# Patient Record
Sex: Male | Born: 1935
Health system: Southern US, Community
[De-identification: ages and names within clinical notes are randomized; demographics above are authoritative.]

## PROBLEM LIST (undated history)

## (undated) DIAGNOSIS — G629 Polyneuropathy, unspecified: Secondary | ICD-10-CM

## (undated) DIAGNOSIS — C801 Malignant (primary) neoplasm, unspecified: Secondary | ICD-10-CM

## (undated) DIAGNOSIS — I10 Essential (primary) hypertension: Secondary | ICD-10-CM

## (undated) DIAGNOSIS — H269 Unspecified cataract: Secondary | ICD-10-CM

## (undated) DIAGNOSIS — C20 Malignant neoplasm of rectum: Secondary | ICD-10-CM

## (undated) DIAGNOSIS — E079 Disorder of thyroid, unspecified: Secondary | ICD-10-CM

## (undated) HISTORY — PX: PROSTATE SURGERY: SHX751

## (undated) HISTORY — PX: HERNIA REPAIR: SHX51

## (undated) HISTORY — PX: EYE SURGERY: SHX253

## (undated) HISTORY — DX: Unspecified cataract: H26.9

## (undated) HISTORY — DX: Polyneuropathy, unspecified: G62.9

## (undated) HISTORY — PX: COLON RESECTION: SHX5231

---

## 1993-11-04 HISTORY — PX: OTHER SURGICAL HISTORY: SHX169

## 1993-11-04 HISTORY — PX: COLONOSCOPY: SHX174

## 2005-03-04 ENCOUNTER — Emergency Department: Payer: Self-pay | Admitting: General Practice

## 2005-03-13 ENCOUNTER — Other Ambulatory Visit: Payer: Self-pay

## 2005-03-14 ENCOUNTER — Inpatient Hospital Stay: Payer: Self-pay | Admitting: Internal Medicine

## 2005-09-19 ENCOUNTER — Emergency Department: Payer: Self-pay | Admitting: Unknown Physician Specialty

## 2005-11-25 ENCOUNTER — Emergency Department: Payer: Self-pay | Admitting: Emergency Medicine

## 2008-12-12 ENCOUNTER — Ambulatory Visit: Payer: Self-pay | Admitting: Family Medicine

## 2009-12-09 ENCOUNTER — Emergency Department: Payer: Self-pay | Admitting: Emergency Medicine

## 2010-05-21 ENCOUNTER — Inpatient Hospital Stay: Payer: Self-pay | Admitting: Surgery

## 2011-08-14 ENCOUNTER — Emergency Department: Payer: Self-pay | Admitting: Emergency Medicine

## 2013-11-10 ENCOUNTER — Emergency Department: Payer: Self-pay | Admitting: Emergency Medicine

## 2013-11-10 LAB — CBC WITH DIFFERENTIAL/PLATELET
BASOS ABS: 0.1 10*3/uL (ref 0.0–0.1)
Basophil %: 0.9 %
EOS ABS: 0.2 10*3/uL (ref 0.0–0.7)
EOS PCT: 2.9 %
HCT: 43.5 % (ref 40.0–52.0)
HGB: 14.2 g/dL (ref 13.0–18.0)
LYMPHS PCT: 18.2 %
Lymphocyte #: 1.3 10*3/uL (ref 1.0–3.6)
MCH: 28.8 pg (ref 26.0–34.0)
MCHC: 32.6 g/dL (ref 32.0–36.0)
MCV: 88 fL (ref 80–100)
MONOS PCT: 12.8 %
Monocyte #: 0.9 x10 3/mm (ref 0.2–1.0)
NEUTROS PCT: 65.2 %
Neutrophil #: 4.5 10*3/uL (ref 1.4–6.5)
PLATELETS: 161 10*3/uL (ref 150–440)
RBC: 4.93 10*6/uL (ref 4.40–5.90)
RDW: 14.2 % (ref 11.5–14.5)
WBC: 7 10*3/uL (ref 3.8–10.6)

## 2013-11-10 LAB — BASIC METABOLIC PANEL
ANION GAP: 10 (ref 7–16)
BUN: 21 mg/dL — ABNORMAL HIGH (ref 7–18)
CALCIUM: 8.9 mg/dL (ref 8.5–10.1)
CHLORIDE: 107 mmol/L (ref 98–107)
CO2: 17 mmol/L — AB (ref 21–32)
CREATININE: 1.07 mg/dL (ref 0.60–1.30)
EGFR (African American): >60
EGFR (Non-African Amer.): 58 — ABNORMAL LOW
GLUCOSE: 103 mg/dL — AB (ref 65–99)
OSMOLALITY: 271 (ref 275–301)
Potassium: 4.2 mmol/L (ref 3.5–5.1)
Sodium: 134 mmol/L — ABNORMAL LOW (ref 136–145)

## 2013-11-10 LAB — TROPONIN I

## 2014-11-11 DIAGNOSIS — G629 Polyneuropathy, unspecified: Secondary | ICD-10-CM | POA: Diagnosis not present

## 2014-11-11 DIAGNOSIS — Z0001 Encounter for general adult medical examination with abnormal findings: Secondary | ICD-10-CM | POA: Diagnosis not present

## 2014-11-11 DIAGNOSIS — I10 Essential (primary) hypertension: Secondary | ICD-10-CM | POA: Diagnosis not present

## 2014-11-11 DIAGNOSIS — Z433 Encounter for attention to colostomy: Secondary | ICD-10-CM | POA: Diagnosis not present

## 2014-11-16 ENCOUNTER — Inpatient Hospital Stay: Payer: Self-pay | Admitting: Surgery

## 2014-11-16 DIAGNOSIS — K5660 Unspecified intestinal obstruction: Secondary | ICD-10-CM | POA: Diagnosis not present

## 2014-11-16 DIAGNOSIS — R112 Nausea with vomiting, unspecified: Secondary | ICD-10-CM | POA: Diagnosis not present

## 2014-11-16 DIAGNOSIS — R9431 Abnormal electrocardiogram [ECG] [EKG]: Secondary | ICD-10-CM | POA: Diagnosis not present

## 2014-11-16 DIAGNOSIS — Z9049 Acquired absence of other specified parts of digestive tract: Secondary | ICD-10-CM | POA: Diagnosis not present

## 2014-11-16 DIAGNOSIS — N183 Chronic kidney disease, stage 3 (moderate): Secondary | ICD-10-CM | POA: Diagnosis not present

## 2014-11-16 DIAGNOSIS — K5669 Other intestinal obstruction: Secondary | ICD-10-CM | POA: Diagnosis not present

## 2014-11-16 DIAGNOSIS — I10 Essential (primary) hypertension: Secondary | ICD-10-CM | POA: Diagnosis not present

## 2014-11-16 DIAGNOSIS — Z932 Ileostomy status: Secondary | ICD-10-CM | POA: Diagnosis not present

## 2014-11-16 DIAGNOSIS — R531 Weakness: Secondary | ICD-10-CM | POA: Diagnosis not present

## 2014-11-16 DIAGNOSIS — E780 Pure hypercholesterolemia: Secondary | ICD-10-CM | POA: Diagnosis not present

## 2014-11-16 DIAGNOSIS — E876 Hypokalemia: Secondary | ICD-10-CM | POA: Diagnosis not present

## 2014-11-16 DIAGNOSIS — I129 Hypertensive chronic kidney disease with stage 1 through stage 4 chronic kidney disease, or unspecified chronic kidney disease: Secondary | ICD-10-CM | POA: Diagnosis not present

## 2014-11-16 DIAGNOSIS — E039 Hypothyroidism, unspecified: Secondary | ICD-10-CM | POA: Diagnosis not present

## 2014-11-16 DIAGNOSIS — K460 Unspecified abdominal hernia with obstruction, without gangrene: Secondary | ICD-10-CM | POA: Diagnosis not present

## 2014-11-16 DIAGNOSIS — K565 Intestinal adhesions [bands] with obstruction (postprocedural) (postinfection): Secondary | ICD-10-CM | POA: Diagnosis not present

## 2014-11-16 LAB — URINALYSIS, COMPLETE
BLOOD: NEGATIVE
Bacteria: NONE SEEN
Bilirubin,UR: NEGATIVE
Glucose,UR: NEGATIVE mg/dL (ref 0–75)
Leukocyte Esterase: NEGATIVE
NITRITE: NEGATIVE
PH: 6 (ref 4.5–8.0)
Protein: 30
Specific Gravity: 1.02 (ref 1.003–1.030)
Squamous Epithelial: 1
WBC UR: 1 /HPF (ref 0–5)

## 2014-11-16 LAB — CBC
HCT: 49.8 % (ref 40.0–52.0)
HGB: 15.9 g/dL (ref 13.0–18.0)
MCH: 28.8 pg (ref 26.0–34.0)
MCHC: 32 g/dL (ref 32.0–36.0)
MCV: 90 fL (ref 80–100)
Platelet: 231 10*3/uL (ref 150–440)
RBC: 5.53 10*6/uL (ref 4.40–5.90)
RDW: 14.7 % — ABNORMAL HIGH (ref 11.5–14.5)
WBC: 16.3 10*3/uL — ABNORMAL HIGH (ref 3.8–10.6)

## 2014-11-16 LAB — COMPREHENSIVE METABOLIC PANEL
AST: 15 U/L (ref 15–37)
Albumin: 3.9 g/dL (ref 3.4–5.0)
Alkaline Phosphatase: 72 U/L
Anion Gap: 8 (ref 7–16)
BUN: 21 mg/dL — ABNORMAL HIGH (ref 7–18)
Bilirubin,Total: 1 mg/dL (ref 0.2–1.0)
CO2: 28 mmol/L (ref 21–32)
Calcium, Total: 8.9 mg/dL (ref 8.5–10.1)
Chloride: 103 mmol/L (ref 98–107)
Creatinine: 1.38 mg/dL — ABNORMAL HIGH (ref 0.60–1.30)
EGFR (African American): >60
GFR CALC NON AF AMER: 51 — AB
Glucose: 205 mg/dL — ABNORMAL HIGH (ref 65–99)
OSMOLALITY: 286 (ref 275–301)
POTASSIUM: 3.4 mmol/L — AB (ref 3.5–5.1)
SGPT (ALT): 20 U/L
SODIUM: 139 mmol/L (ref 136–145)
Total Protein: 7.3 g/dL (ref 6.4–8.2)

## 2014-11-16 LAB — LIPASE, BLOOD: Lipase: 251 U/L (ref 73–393)

## 2014-11-16 LAB — TROPONIN I: Troponin-I: <0.02 ng/mL

## 2014-11-17 LAB — BASIC METABOLIC PANEL
Anion Gap: 4 — ABNORMAL LOW (ref 7–16)
BUN: 21 mg/dL — ABNORMAL HIGH (ref 7–18)
CALCIUM: 8.2 mg/dL — AB (ref 8.5–10.1)
CO2: 31 mmol/L (ref 21–32)
Chloride: 108 mmol/L — ABNORMAL HIGH (ref 98–107)
Creatinine: 1.34 mg/dL — ABNORMAL HIGH (ref 0.60–1.30)
EGFR (Non-African Amer.): 52 — ABNORMAL LOW
Glucose: 131 mg/dL — ABNORMAL HIGH (ref 65–99)
OSMOLALITY: 290 (ref 275–301)
Potassium: 4.7 mmol/L (ref 3.5–5.1)
Sodium: 143 mmol/L (ref 136–145)

## 2014-11-17 LAB — CBC WITH DIFFERENTIAL/PLATELET
Basophil #: 0 10*3/uL (ref 0.0–0.1)
Basophil %: 0.4 %
Eosinophil #: 0.1 10*3/uL (ref 0.0–0.7)
Eosinophil %: 0.6 %
HCT: 43.9 % (ref 40.0–52.0)
HGB: 14 g/dL (ref 13.0–18.0)
LYMPHS ABS: 1.3 10*3/uL (ref 1.0–3.6)
Lymphocyte %: 9.8 %
MCH: 29.2 pg (ref 26.0–34.0)
MCHC: 32 g/dL (ref 32.0–36.0)
MCV: 91 fL (ref 80–100)
Monocyte #: 2 x10 3/mm — ABNORMAL HIGH (ref 0.2–1.0)
Monocyte %: 14.3 %
NEUTROS ABS: 10.2 10*3/uL — AB (ref 1.4–6.5)
Neutrophil %: 74.9 %
PLATELETS: 192 10*3/uL (ref 150–440)
RBC: 4.8 10*6/uL (ref 4.40–5.90)
RDW: 15 % — AB (ref 11.5–14.5)
WBC: 13.7 10*3/uL — ABNORMAL HIGH (ref 3.8–10.6)

## 2014-11-19 LAB — BASIC METABOLIC PANEL
Anion Gap: 7 (ref 7–16)
BUN: 16 mg/dL (ref 7–18)
CALCIUM: 7.9 mg/dL — AB (ref 8.5–10.1)
Chloride: 109 mmol/L — ABNORMAL HIGH (ref 98–107)
Co2: 26 mmol/L (ref 21–32)
Creatinine: 0.97 mg/dL (ref 0.60–1.30)
EGFR (African American): >60
GLUCOSE: 90 mg/dL (ref 65–99)
OSMOLALITY: 284 (ref 275–301)
Potassium: 3.8 mmol/L (ref 3.5–5.1)
Sodium: 142 mmol/L (ref 136–145)

## 2014-12-01 DIAGNOSIS — K560 Paralytic ileus: Secondary | ICD-10-CM | POA: Diagnosis not present

## 2014-12-01 DIAGNOSIS — I10 Essential (primary) hypertension: Secondary | ICD-10-CM | POA: Diagnosis not present

## 2014-12-08 DIAGNOSIS — C61 Malignant neoplasm of prostate: Secondary | ICD-10-CM | POA: Diagnosis not present

## 2014-12-08 DIAGNOSIS — I10 Essential (primary) hypertension: Secondary | ICD-10-CM | POA: Diagnosis not present

## 2015-01-18 DIAGNOSIS — M25569 Pain in unspecified knee: Secondary | ICD-10-CM | POA: Diagnosis not present

## 2015-01-18 DIAGNOSIS — Z933 Colostomy status: Secondary | ICD-10-CM | POA: Diagnosis not present

## 2015-02-13 DIAGNOSIS — Z0000 Encounter for general adult medical examination without abnormal findings: Secondary | ICD-10-CM | POA: Diagnosis not present

## 2015-02-13 DIAGNOSIS — C61 Malignant neoplasm of prostate: Secondary | ICD-10-CM | POA: Diagnosis not present

## 2015-02-13 DIAGNOSIS — R5381 Other malaise: Secondary | ICD-10-CM | POA: Diagnosis not present

## 2015-02-27 DIAGNOSIS — I10 Essential (primary) hypertension: Secondary | ICD-10-CM | POA: Diagnosis not present

## 2015-03-05 NOTE — Discharge Summary (Signed)
PATIENT NAME:  Gregory Valdez, Gregory Valdez MR#:  [id number] DATE OF BIRTH:  Oct 13, 1915  DATE OF ADMISSION:  11/16/2014 DATE OF DISCHARGE:  11/19/2014  BRIEF HISTORY: Gregory Valdez is a 81 year old gentleman with previous total colectomy and end ileostomy with a known peristomal hernia. He developed evidence of obstruction with lack of ostomy output and significant crampy abdominal pain. CT scan, however, demonstrated a more proximal area of stenosis, which appeared to be adhesive in nature. The patient was placed on nasogastric decompression. His symptoms improved. He is able to tolerate a regular diet at this point and his ostomy function is near normal. His hernia is easily reducible. No other particular problems. We will plan to discharge this morning. He will be followed in the office in the future as necessary. We will return him to his primary care physician's care.  DISCHARGE MEDICATIONS: Include his normal medicines, levothyroxine 88 mcg once a day and captopril 50 mg b.i.d.   FINAL DISCHARGE DIAGNOSIS: Partial small bowel obstruction.    ____________________________ Rodena Goldmann III, MD rle:ts D: 11/19/2014 10:59:00 ET T: 11/19/2014 21:52:51 ET JOB#: [id number]  cc: Rodena Goldmann III, MD, <Dictator> Dion Body, MD Rodena Goldmann MD ELECTRONICALLY SIGNED 11/28/2014 20:09

## 2015-03-05 NOTE — Consult Note (Signed)
PATIENT NAME:  Gregory Valdez, Gregory Valdez MR#:  [id number] DATE OF BIRTH:  04-Oct-1915  DATE OF CONSULTATION:  11/16/2014  ADMITTING PHYSICIAN:  Rodena Goldmann III, MD  PRIMARY CARE PHYSICIAN:   Cletis Athens, MD  CONSULTING PHYSICIAN:  Gladstone Lighter, MD  REASON FOR CONSULTATION:  Medical management.  HISTORY OF PRESENT ILLNESS: [name] is a 81 year old, very active, functional Caucasian male with past medical history significant for hypertension, prior colostomy with ileostomy bag currently for diverticulosis in the past, prior bowel obstruction requiring adhesion lysis, hypothyroidism, presents to the hospital secondary to sudden onset of nausea and vomiting since last night. The patient said that he had a prior bowel obstruction about a few years ago for which [name] had managed him in the past. He was doing fine up until last night when he suddenly felt abdominal pain, distention, nausea, vomiting, kept on going until this morning. Presents to the hospital. CT showing small bowel obstruction. So, he is being admitted and medical consult has been requested for medical management.   The patient only has a history of hypertension and hypothyroidism. Blood pressure is elevated, currently, may be partially from pain and distress. He takes only 2 medications at home. Has not been sick recently.  Has been doing well otherwise.   PAST MEDICAL HISTORY: 1.  Hypertension.  2.  Hypothyroidism.  3.  History of pancreatitis in the past.   PAST SURGICAL HISTORY:  1.  Partial colectomy with ileostomy in the right lower quadrant. 2.  Hernia repair.  3.  Left ankle surgery.   ALLERGIES TO MEDICATIONS: SULFA.   CURRENT HOME MEDICATIONS:  1.  Levothyroxine 88 mcg 1 tablet p.o. daily.  2.  Captopril 50 mg p.o. b.i.d.   SOCIAL HISTORY: Lives at home by himself, has a walker and a cane, but does not use them often. No smoking or alcohol use.   FAMILY HISTORY: Significant for multiple types of cancer running  in the family and also diabetes mellitus.   REVIEW OF SYSTEMS:  CONSTITUTIONAL: No fever, fatigue, or weakness.  EYES: No blurred vision, double vision, inflammation, or glaucoma.  ENT: No tinnitus, ear pain, hearing loss, epistaxis, or discharge.  RESPIRATORY: No cough, wheeze, hemoptysis, or COPD.  CARDIOVASCULAR: No chest pain, orthopnea, edema, arrhythmia, palpitations, or syncope.  GASTROINTESTINAL:  Positive for nausea, vomiting. No diarrhea, positive for abdominal pain from intestinal obstruction. GENITOURINARY:  No dysuria, hematuria, renal calculus, frequency, or incontinence.  ENDOCRINE: No polyuria, nocturia, thyroid problems, heat or cold intolerance.  HEMATOLOGY: No anemia, easy bruising, or bleeding.  SKIN: No acne, rash, or lesions.  MUSCULOSKELETAL: No neck, back, shoulder pain, arthritis, or gout.  NEUROLOGIC: No numbness, weakness, CVA, TIA, or seizures.  PSYCHOLOGICAL: No anxiety, insomnia, or depression.   PHYSICAL EXAMINATION:  VITAL SIGNS: Temperature 98.5 degrees Fahrenheit, pulse 96, respirations 18, blood pressure 186/98, pulse oximetry 94% on  2 liters.  GENERAL: Well-built, well-nourished male sitting in a chair, not in any acute distress.  HEENT: Normocephalic, atraumatic. Pupils equal, round, reacting to light. Anicteric sclerae. Extraocular movements intact. Oropharynx clear without erythema, mass, or exudates.  NECK: Supple. No thyromegaly, JVD, or carotid bruits.  No lymphadenopathy. LUNGS: Moving air bilaterally. No wheezes or crackles.  No use of accessory muscles for breathing. Decreased bibasilar breath sounds.  CARDIOVASCULAR: S1, S2, regular rate and rhythm. A 2/6 systolic murmur present. No  rubs or gallops.  ABDOMEN: Soft, with voluntary guarding.  No rigidity or rebound tenderness.  Ileostomy bag in right  lower quadrant and hernia present. Hyperactive bowel sounds are present.  No hepatosplenomegaly.  EXTREMITIES: No pedal edema. No clubbing or  cyanosis, 2+ dorsalis pedis pulses palpable bilaterally.  SKIN: No acne, rash, or lesions.  LYMPHATIC: No cervical or inguinal lymphadenopathy.  NEUROLOGIC: Cranial nerves intact. No focal motor or sensory deficits.  PSYCHOLOGICAL: The patient is awake, alert, oriented x 3.   LABORATORY DATA: Sodium 139, potassium 3.4, chloride 103, bicarbonate 28, BUN 21, creatinine 1.3, glucose 205, and calcium of 8.9. ALT 20, AST 15, alkaline phosphatase 72, total bilirubin 1.0, albumin of 3.9. WBC 16.3, hemoglobin 15.9, hematocrit 49.8, platelet count 231,000 and lipase 251. Troponin is negative. Urinalysis negative for any infection.   IMAGING:  CT of the abdomen and pelvis with contrast showing prior colectomy. Findings compatible with small bowel obstruction, previous adhesions present, multiple herniated small bowel loops at ostomy site.  No level of obstruction at that site.  ASSESSMENT AND RECOMMENDATIONS: A 81 year old male with hypertension, hypothyroidism, prior colectomy and ileostomy, who presents with small bowel obstruction. Medical consult has been requested for medical management.  1.  Hypertension, elevated blood pressure, not sure if it is pain related. He is on captopril p.o. b.i.d.   Currently strict n.p.o. from is bowel obstruction.  Will place on clonidine patch and also hydralazine p.o. and intravenous.  2.   Hypothyroidism.  Hold Synthroid for a couple of days, while he is n.p.o. can be restarted. If he still continues to be n.p.o., then probably will change it to intravenous dose at that time.  3.  Small bowel obstruction. Management per surgery. Currently strict n.p.o., has nasogastric tube, trying to see if he can be managed conservatively. Antiemetics, IV fluids.  4.  Hypokalemia, being replaced. Recheck in a.m.  5.  Acute renal failure, likely prerenal from  nausea, vomiting. Intravenous fluids and monitor. 6.  Deep vein thrombosis prophylaxis, thromboembolic deterrent, sequential  compression devices and patient is ambulated.  CODE STATUS:  Full code.   TIME SPENT ON ADMISSION: 50 minutes.    ____________________________ Gladstone Lighter, MD rk:LT D: 11/16/2014 17:57:55 ET T: 11/16/2014 18:28:52 ET JOB#: [id number]  cc: Gladstone Lighter, MD, <Dictator> Cletis Athens, MD Micheline Maze, MD Gladstone Lighter MD ELECTRONICALLY SIGNED 11/21/2014 16:17

## 2015-04-06 DIAGNOSIS — H3531 Nonexudative age-related macular degeneration: Secondary | ICD-10-CM | POA: Diagnosis not present

## 2015-04-13 DIAGNOSIS — Z933 Colostomy status: Secondary | ICD-10-CM | POA: Diagnosis not present

## 2015-04-13 DIAGNOSIS — M25569 Pain in unspecified knee: Secondary | ICD-10-CM | POA: Diagnosis not present

## 2015-04-28 DIAGNOSIS — L259 Unspecified contact dermatitis, unspecified cause: Secondary | ICD-10-CM | POA: Diagnosis not present

## 2015-04-28 DIAGNOSIS — C61 Malignant neoplasm of prostate: Secondary | ICD-10-CM | POA: Diagnosis not present

## 2015-04-28 DIAGNOSIS — I10 Essential (primary) hypertension: Secondary | ICD-10-CM | POA: Diagnosis not present

## 2015-04-28 DIAGNOSIS — R2681 Unsteadiness on feet: Secondary | ICD-10-CM | POA: Diagnosis not present

## 2015-05-02 DIAGNOSIS — L259 Unspecified contact dermatitis, unspecified cause: Secondary | ICD-10-CM | POA: Diagnosis not present

## 2015-05-02 DIAGNOSIS — I10 Essential (primary) hypertension: Secondary | ICD-10-CM | POA: Diagnosis not present

## 2015-05-15 DIAGNOSIS — R2681 Unsteadiness on feet: Secondary | ICD-10-CM | POA: Diagnosis not present

## 2015-05-25 DIAGNOSIS — I10 Essential (primary) hypertension: Secondary | ICD-10-CM | POA: Diagnosis not present

## 2015-05-25 DIAGNOSIS — K625 Hemorrhage of anus and rectum: Secondary | ICD-10-CM | POA: Diagnosis not present

## 2015-06-07 DIAGNOSIS — I10 Essential (primary) hypertension: Secondary | ICD-10-CM | POA: Diagnosis not present

## 2015-06-07 DIAGNOSIS — K625 Hemorrhage of anus and rectum: Secondary | ICD-10-CM | POA: Diagnosis not present

## 2015-06-07 DIAGNOSIS — C61 Malignant neoplasm of prostate: Secondary | ICD-10-CM | POA: Diagnosis not present

## 2015-06-26 DIAGNOSIS — I10 Essential (primary) hypertension: Secondary | ICD-10-CM | POA: Diagnosis not present

## 2015-06-26 DIAGNOSIS — J209 Acute bronchitis, unspecified: Secondary | ICD-10-CM | POA: Diagnosis not present

## 2015-07-01 ENCOUNTER — Emergency Department
Admission: EM | Admit: 2015-07-01 | Discharge: 2015-07-01 | Disposition: A | Discharge status: Home / Self Care | Payer: Medicare Other | Attending: Emergency Medicine | Admitting: Emergency Medicine

## 2015-07-01 ENCOUNTER — Emergency Department: Payer: Medicare Other

## 2015-07-01 ENCOUNTER — Encounter: Payer: Self-pay | Admitting: *Deleted

## 2015-07-01 DIAGNOSIS — Z79899 Other long term (current) drug therapy: Secondary | ICD-10-CM | POA: Diagnosis not present

## 2015-07-01 DIAGNOSIS — R1084 Generalized abdominal pain: Secondary | ICD-10-CM | POA: Diagnosis present

## 2015-07-01 DIAGNOSIS — R1032 Left lower quadrant pain: Secondary | ICD-10-CM | POA: Diagnosis not present

## 2015-07-01 DIAGNOSIS — N289 Disorder of kidney and ureter, unspecified: Secondary | ICD-10-CM | POA: Diagnosis not present

## 2015-07-01 DIAGNOSIS — I10 Essential (primary) hypertension: Secondary | ICD-10-CM | POA: Insufficient documentation

## 2015-07-01 DIAGNOSIS — N281 Cyst of kidney, acquired: Secondary | ICD-10-CM | POA: Diagnosis not present

## 2015-07-01 DIAGNOSIS — N400 Enlarged prostate without lower urinary tract symptoms: Secondary | ICD-10-CM | POA: Diagnosis not present

## 2015-07-01 DIAGNOSIS — R109 Unspecified abdominal pain: Secondary | ICD-10-CM

## 2015-07-01 DIAGNOSIS — Z7952 Long term (current) use of systemic steroids: Secondary | ICD-10-CM | POA: Diagnosis not present

## 2015-07-01 DIAGNOSIS — Z9049 Acquired absence of other specified parts of digestive tract: Secondary | ICD-10-CM | POA: Diagnosis not present

## 2015-07-01 HISTORY — DX: Malignant (primary) neoplasm, unspecified: C80.1

## 2015-07-01 HISTORY — DX: Essential (primary) hypertension: I10

## 2015-07-01 HISTORY — DX: Disorder of thyroid, unspecified: E07.9

## 2015-07-01 LAB — COMPREHENSIVE METABOLIC PANEL
ALK PHOS: 50 U/L (ref 38–126)
ALT: 14 U/L — ABNORMAL LOW (ref 17–63)
ANION GAP: 4 — AB (ref 5–15)
AST: 23 U/L (ref 15–41)
Albumin: 3.7 g/dL (ref 3.5–5.0)
BUN: 25 mg/dL — ABNORMAL HIGH (ref 6–20)
CALCIUM: 9.2 mg/dL (ref 8.9–10.3)
CO2: 32 mmol/L (ref 22–32)
CREATININE: 1.15 mg/dL (ref 0.61–1.24)
Chloride: 104 mmol/L (ref 101–111)
GFR, EST AFRICAN AMERICAN: 59 mL/min — AB (ref >60)
GFR, EST NON AFRICAN AMERICAN: 51 mL/min — AB (ref >60)
Glucose, Bld: 132 mg/dL — ABNORMAL HIGH (ref 65–99)
Potassium: 3.8 mmol/L (ref 3.5–5.1)
SODIUM: 140 mmol/L (ref 135–145)
TOTAL PROTEIN: 6.3 g/dL — AB (ref 6.5–8.1)
Total Bilirubin: 0.9 mg/dL (ref 0.3–1.2)

## 2015-07-01 LAB — CBC
HCT: 41.3 % (ref 40.0–52.0)
HEMOGLOBIN: 13.5 g/dL (ref 13.0–18.0)
MCH: 28.5 pg (ref 26.0–34.0)
MCHC: 32.6 g/dL (ref 32.0–36.0)
MCV: 87.4 fL (ref 80.0–100.0)
Platelets: 205 10*3/uL (ref 150–440)
RBC: 4.72 MIL/uL (ref 4.40–5.90)
RDW: 14.9 % — ABNORMAL HIGH (ref 11.5–14.5)
WBC: 11.3 10*3/uL — ABNORMAL HIGH (ref 3.8–10.6)

## 2015-07-01 MED ORDER — IOHEXOL 300 MG/ML  SOLN
80.0000 mL | Freq: Once | INTRAMUSCULAR | Status: AC | PRN
  Administered 2015-07-01: 80 mL via INTRAVENOUS

## 2015-07-01 MED ORDER — IOHEXOL 240 MG/ML SOLN
25.0000 mL | Freq: Once | INTRAMUSCULAR | Status: AC | PRN
  Administered 2015-07-01: 25 mL via ORAL

## 2015-07-01 NOTE — ED Provider Notes (Signed)
[facility] Emergency Department Provider Note  ____________________________________________  Time seen: 2:00 AM  I have reviewed the triage vital signs and the nursing notes.   HISTORY  Chief Complaint Abdominal Pain     HPI Gregory Valdez is a 81 y.o. male resents with history of generalized abdominal pain with onset last night that has since resolved. Patient stated that he noted no ostomy output while he had generalized abdominal pain. However patient stated after couple hours ostomy output resumed and his abdominal pain resolved. Patient denies any nausea vomiting no fever     Past Medical History  Diagnosis Date  . Hypertension   . Thyroid disease   . Cancer     Prostate cancer with XRT    There are no active problems to display for this patient.   Past Surgical History  Procedure Laterality Date  . Colostomy    . Colon resection      Current Outpatient Rx  Name  Route  Sig  Dispense  Refill  . cloNIDine (CATAPRES) 0.1 MG tablet   Oral   Take 0.1 mg by mouth 2 (two) times daily.         Marland Kitchen gabapentin (NEURONTIN) 100 MG capsule   Oral   Take 100 mg by mouth at bedtime.         . hydrocortisone (ANUSOL-HC) 25 MG suppository   Rectal   Place 25 mg rectally 2 (two) times daily.         Marland Kitchen levothyroxine (SYNTHROID, LEVOTHROID) 88 MCG tablet   Oral   Take 88 mcg by mouth daily before breakfast.         . loratadine (CLARITIN) 10 MG tablet   Oral   Take 10 mg by mouth daily.         . tamsulosin (FLOMAX) 0.4 MG CAPS capsule   Oral   Take 0.4 mg by mouth.           Allergies Sulfa antibiotics  History reviewed. No pertinent family history.  Social History Social History  Substance Use Topics  . Smoking status: Never Smoker   . Smokeless tobacco: None  . Alcohol Use: No    Review of Systems  Constitutional: Negative for fever. Eyes: Negative for visual changes. ENT: Negative for sore  throat. Cardiovascular: Negative for chest pain. Respiratory: Negative for shortness of breath. Gastrointestinal: Positive for abdominal pain, negative for vomiting and diarrhea. Genitourinary: Negative for dysuria. Musculoskeletal: Negative for back pain. Skin: Negative for rash. Neurological: Negative for headaches, focal weakness or numbness.   10-point ROS otherwise negative.  ____________________________________________   PHYSICAL EXAM:  VITAL SIGNS: ED Triage Vitals  Enc Vitals Group     BP 07/01/15 0107 173/87 mmHg     Pulse Rate 07/01/15 0107 94     Resp 07/01/15 0107 16     Temp 07/01/15 0107 98 F (36.7 C)     Temp Source 07/01/15 0107 Oral     SpO2 07/01/15 0107 94 %     Weight 07/01/15 0107 170 lb (77.111 kg)     Height 07/01/15 0107 5' 11.5" (1.816 m)     Head Cir --      Peak Flow --      Pain Score 07/01/15 0108 0     Pain Loc --      Pain Edu? --      Excl. in [place]? --      Constitutional: Alert and oriented. Well appearing and in  no distress. Eyes: Conjunctivae are normal. PERRL. Normal extraocular movements. ENT   Head: Normocephalic and atraumatic.   Nose: No congestion/rhinnorhea.   Mouth/Throat: Mucous membranes are moist.   Neck: No stridor. Hematological/Lymphatic/Immunilogical: No cervical lymphadenopathy. Cardiovascular: Normal rate, regular rhythm. Normal and symmetric distal pulses are present in all extremities. No murmurs, rubs, or gallops. Respiratory: Normal respiratory effort without tachypnea nor retractions. Breath sounds are clear and equal bilaterally. No wheezes/rales/rhonchi. Gastrointestinal: Left upper quadrant pain with palpation. No distention. There is no CVA tenderness. Genitourinary: deferred Musculoskeletal: Nontender with normal range of motion in all extremities. No joint effusions.  No lower extremity tenderness nor edema. Neurologic:  Normal speech and language. No gross focal neurologic deficits are  appreciated. Speech is normal.  Skin:  Skin is warm, dry and intact. No rash noted. Psychiatric: Mood and affect are normal. Speech and behavior are normal. Patient exhibits appropriate insight and judgment.  ____________________________________________    LABS (pertinent positives/negatives)  Labs Reviewed  CBC - Abnormal; Notable for the following:    WBC 11.3 (*)    RDW 14.9 (*)    All other components within normal limits  COMPREHENSIVE METABOLIC PANEL - Abnormal; Notable for the following:    Glucose, Bld 132 (*)    BUN 25 (*)    Total Protein 6.3 (*)    ALT 14 (*)    GFR calc non Af Amer 51 (*)    GFR calc Af Amer 59 (*)    Anion gap 4 (*)    All other components within normal limits      RADIOLOGY  CT scan of the abdomen revealed    CT Abdomen Pelvis W Contrast (Final result) Result time: 07/01/15 04:14:22   Final result by Rad Results In Interface (07/01/15 04:14:22)   Narrative:   CLINICAL DATA: Left upper quadrant pain with decreased ostomy output. Patient reports the colostomy just started aches battling stool. Abdominal pain has resolved. History of colon resection and colostomy.  EXAM: CT ABDOMEN AND PELVIS WITH CONTRAST  TECHNIQUE: Multidetector CT imaging of the abdomen and pelvis was performed using the standard protocol following bolus administration of intravenous contrast.  CONTRAST: 24mL OMNIPAQUE IOHEXOL 300 MG/ML SOLN  COMPARISON: 11/16/2014  FINDINGS: Right lung base nodule posteriorly measuring 7.6 mm. Multiple subpleural nodules. No significant change since previous study.  Mild bile duct dilatation similar to prior study. No obstructing lesion or stone demonstrated. Correlation with liver function tests is recommended. Gallbladder is contracted. This is likely to be physiologic. Liver, spleen, pancreas, adrenal glands, inferior vena cava, and retroperitoneal lymph nodes are unremarkable. Calcification of the aorta without  aneurysm. Sub cm low-attenuation lesions in the kidneys likely representing cysts. Cysts in the lower pole of the right kidney measuring up to 2.2 cm. No hydronephrosis in either kidney. Postoperative changes in the upper abdomen with surgical clips present. Postoperative colectomy with right lower quadrant ileostomy. Peristomal however hernia containing bowel loops. Small bowel are decompressed. No evidence of obstruction. No free air or free fluid in the abdomen.  Pelvis: Rectal stump appears intact. Prostate gland is enlarged. Bladder wall is not thickened. No free or loculated pelvic fluid collections. No pelvic mass or lymphadenopathy. Degenerative changes in the spine. No destructive bone lesions. Central compression of superior implant out L1 and L2. No change since previous study.  IMPRESSION: Postoperative changes with colectomy and right lower quadrant ileostomy. Peristomal hernia. No evidence of obstruction.   Electronically Signed By: Lucienne Capers M.D. On: 07/01/2015 04:14  INITIAL IMPRESSION / ASSESSMENT AND PLAN / ED COURSE  Pertinent labs & imaging results that were available during my care of the patient were reviewed by me and considered in my medical decision making (see chart for details).  History of physical exam concerning for possible intermittent bowel obstruction. CT scan of the abdomen normal at this time as such patient will be discharged home. Patient has no pain at this time. ____________________________________________   FINAL CLINICAL IMPRESSION(S) / ED DIAGNOSES  Final diagnoses:  Abdominal pain, unspecified abdominal location      Gregor Hams, MD 07/01/15 [phone]

## 2015-07-01 NOTE — ED Notes (Signed)
Pt reports decreased output from his colostomy and was experiencing abd pain earlier this evening, pt states he took a zantac at home w/ some relief.

## 2015-07-01 NOTE — ED Notes (Signed)
Family at bedside. 

## 2015-07-01 NOTE — Discharge Instructions (Signed)

## 2015-07-01 NOTE — ED Notes (Signed)
Pt reports he thought his colostomy was "stopped up" but it just started expelling stool.  Stool liquid, yellow/brown.  Pt reports he had abd pain earlier but none currently.  Pt NAD at this time.

## 2015-07-17 DIAGNOSIS — Z933 Colostomy status: Secondary | ICD-10-CM | POA: Diagnosis not present

## 2015-07-17 DIAGNOSIS — M25569 Pain in unspecified knee: Secondary | ICD-10-CM | POA: Diagnosis not present

## 2015-07-24 DIAGNOSIS — L570 Actinic keratosis: Secondary | ICD-10-CM | POA: Diagnosis not present

## 2015-07-24 DIAGNOSIS — L821 Other seborrheic keratosis: Secondary | ICD-10-CM | POA: Diagnosis not present

## 2015-08-03 DIAGNOSIS — L718 Other rosacea: Secondary | ICD-10-CM | POA: Diagnosis not present

## 2015-08-03 DIAGNOSIS — I10 Essential (primary) hypertension: Secondary | ICD-10-CM | POA: Diagnosis not present

## 2015-08-03 DIAGNOSIS — L42 Pityriasis rosea: Secondary | ICD-10-CM | POA: Diagnosis not present

## 2015-08-03 DIAGNOSIS — C61 Malignant neoplasm of prostate: Secondary | ICD-10-CM | POA: Diagnosis not present

## 2015-08-04 DIAGNOSIS — Z23 Encounter for immunization: Secondary | ICD-10-CM | POA: Diagnosis not present

## 2015-10-11 DIAGNOSIS — M25569 Pain in unspecified knee: Secondary | ICD-10-CM | POA: Diagnosis not present

## 2015-10-11 DIAGNOSIS — Z933 Colostomy status: Secondary | ICD-10-CM | POA: Diagnosis not present

## 2015-10-21 ENCOUNTER — Encounter: Payer: Self-pay | Admitting: *Deleted

## 2015-10-21 ENCOUNTER — Emergency Department
Admission: EM | Admit: 2015-10-21 | Discharge: 2015-10-21 | Disposition: A | Discharge status: Home / Self Care | Payer: Medicare Other | Attending: Emergency Medicine | Admitting: Emergency Medicine

## 2015-10-21 DIAGNOSIS — K625 Hemorrhage of anus and rectum: Secondary | ICD-10-CM | POA: Diagnosis not present

## 2015-10-21 DIAGNOSIS — Z7952 Long term (current) use of systemic steroids: Secondary | ICD-10-CM | POA: Insufficient documentation

## 2015-10-21 DIAGNOSIS — Z79899 Other long term (current) drug therapy: Secondary | ICD-10-CM | POA: Diagnosis not present

## 2015-10-21 DIAGNOSIS — K922 Gastrointestinal hemorrhage, unspecified: Secondary | ICD-10-CM | POA: Diagnosis not present

## 2015-10-21 DIAGNOSIS — I159 Secondary hypertension, unspecified: Secondary | ICD-10-CM | POA: Insufficient documentation

## 2015-10-21 LAB — COMPREHENSIVE METABOLIC PANEL
ALBUMIN: 4 g/dL (ref 3.5–5.0)
ALK PHOS: 52 U/L (ref 38–126)
ALT: 14 U/L — ABNORMAL LOW (ref 17–63)
ANION GAP: 8 (ref 5–15)
AST: 24 U/L (ref 15–41)
BUN: 23 mg/dL — ABNORMAL HIGH (ref 6–20)
CALCIUM: 8.8 mg/dL — AB (ref 8.9–10.3)
CO2: 26 mmol/L (ref 22–32)
Chloride: 105 mmol/L (ref 101–111)
Creatinine, Ser: 0.99 mg/dL (ref 0.61–1.24)
GFR calc non Af Amer: >60 mL/min (ref >60)
Glucose, Bld: 130 mg/dL — ABNORMAL HIGH (ref 65–99)
POTASSIUM: 3.8 mmol/L (ref 3.5–5.1)
SODIUM: 139 mmol/L (ref 135–145)
TOTAL PROTEIN: 6.9 g/dL (ref 6.5–8.1)
Total Bilirubin: 0.8 mg/dL (ref 0.3–1.2)

## 2015-10-21 LAB — CBC
HEMATOCRIT: 44 % (ref 40.0–52.0)
HEMOGLOBIN: 14 g/dL (ref 13.0–18.0)
MCH: 28 pg (ref 26.0–34.0)
MCHC: 31.8 g/dL — ABNORMAL LOW (ref 32.0–36.0)
MCV: 88 fL (ref 80.0–100.0)
Platelets: 205 10*3/uL (ref 150–440)
RBC: 5 MIL/uL (ref 4.40–5.90)
RDW: 14.9 % — ABNORMAL HIGH (ref 11.5–14.5)
WBC: 7.8 10*3/uL (ref 3.8–10.6)

## 2015-10-21 LAB — TYPE AND SCREEN
ABO/RH(D): O POS
ANTIBODY SCREEN: NEGATIVE

## 2015-10-21 LAB — HEMOGLOBIN AND HEMATOCRIT, BLOOD
HEMATOCRIT: 42.1 % (ref 40.0–52.0)
HEMOGLOBIN: 13.9 g/dL (ref 13.0–18.0)

## 2015-10-21 LAB — ABO/RH: ABO/RH(D): O POS

## 2015-10-21 NOTE — ED Notes (Addendum)
Pt has colostomy bag but states this AM rectal bleeding, states he had this about a month ago but it stopped, pt states some urinary frequency, deneis any pain, states some increasing weakness

## 2015-10-21 NOTE — ED Notes (Signed)
Pt informed to return if any life threatening symptoms occur.  

## 2015-10-21 NOTE — Discharge Instructions (Signed)
You were seen in the emergency room for blood coming from your rectum. It is important that you follow up closely with your primary care doctor  And gastroenterology in the next couple of days.  If you're unable to see your primary care doctor you may return to the emergency room or go to the [place] walk-in clinic in 1 or 2 days for reexam.  Please return to the emergency room right away if you are to develop a fever, severe nausea, your bleeding becomes severe or worsens, you are unable to keep food down, begin vomiting any dark or bloody fluid, you develop any dark or bloody stools, feel dehydrated, or other new concerns or symptoms arise.  Also, her blood pressure was high today, which indicated he had not had her medication. Please make sure you take your normal blood pressure medication but needed home today. Follow up with your doctor about this as well.

## 2015-10-21 NOTE — ED Provider Notes (Signed)
[facility] Emergency Department Provider Note REMINDER - THIS NOTE IS NOT A FINAL MEDICAL RECORD UNTIL IT IS SIGNED. UNTIL THEN, THE CONTENT BELOW MAY REFLECT INFORMATION FROM A DOCUMENTATION TEMPLATE, NOT THE ACTUAL PATIENT VISIT. ____________________________________________  Time seen: Approximately 3:10 PM  I have reviewed the triage vital signs and the nursing notes.   HISTORY  Chief Complaint Rectal Bleeding    HPI Gregory Valdez is a 81 y.o. male reports rectal bleeding.  Patient reports that for about the last 3-4 weeks he's noticed occasional blood in the occasional liquid stools he has from the rectum. Patient reports that he had a colectomy about 20 years ago and that his rectum is no longer attached to his remaining colon.   denies any abdominal pain nausea or vomiting. His only noticed a small amount of blood mixed with his occasional yellow liquidy stool that he experiences once every couple of days from the rectum. He has not had any blood or dark contents in his colostomy.  Reports his last colonoscopy was years ago.   Past Medical History  Diagnosis Date  . Hypertension   . Thyroid disease   . Cancer [facility])     Prostate cancer with XRT    There are no active problems to display for this patient.   Past Surgical History  Procedure Laterality Date  . Colostomy    . Colon resection    . Colostomy      Current Outpatient Rx  Name  Route  Sig  Dispense  Refill  . cloNIDine (CATAPRES) 0.1 MG tablet   Oral   Take 0.1 mg by mouth 2 (two) times daily.         Marland Kitchen gabapentin (NEURONTIN) 100 MG capsule   Oral   Take 100 mg by mouth at bedtime.         . hydrocortisone (ANUSOL-HC) 25 MG suppository   Rectal   Place 25 mg rectally 2 (two) times daily.         Marland Kitchen levothyroxine (SYNTHROID, LEVOTHROID) 88 MCG tablet   Oral   Take 88 mcg by mouth daily before breakfast.         . loratadine (CLARITIN) 10 MG tablet   Oral  Take 10 mg by mouth daily.         . tamsulosin (FLOMAX) 0.4 MG CAPS capsule   Oral   Take 0.4 mg by mouth.           Allergies Sulfa antibiotics  History reviewed. No pertinent family history.  Social History Social History  Substance Use Topics  . Smoking status: Never Smoker   . Smokeless tobacco: None  . Alcohol Use: No    Review of Systems Constitutional: No fever/chills Eyes: No visual changes. ENT: No sore throat. Cardiovascular: Denies chest pain. Respiratory: Denies shortness of breath. Gastrointestinal: No abdominal pain.  No nausea, no vomiting.  No diarrhea.  No constipation. Genitourinary: Negative for dysuria. Musculoskeletal: Negative for back pain. Skin: Negative for rash. Neurological: Negative for headaches, focal weakness or numbness.Does not feel weak or lightheaded.  10-point ROS otherwise negative.  ____________________________________________   PHYSICAL EXAM:  VITAL SIGNS: ED Triage Vitals  Enc Vitals Group     BP 10/21/15 1300 219/98 mmHg     Pulse Rate 10/21/15 1300 86     Resp 10/21/15 1300 18     Temp 10/21/15 1300 98.1 F (36.7 C)     Temp Source 10/21/15 1300 Oral  SpO2 10/21/15 1300 96 %     Weight 10/21/15 1300 170 lb (77.111 kg)     Height 10/21/15 1300 5\' 11"  (1.803 m)     Head Cir --      Peak Flow --      Pain Score --      Pain Loc --      Pain Edu? --      Excl. in [place]? --    Constitutional: Alert and oriented. Well appearing and in no acute distress. Eyes: Conjunctivae are normal. PERRL. EOMI. Head: Atraumatic. Nose: No congestion/rhinnorhea. Mouth/Throat: Mucous membranes are moist.  Oropharynx non-erythematous. Neck: No stridor.   Cardiovascular: Normal rate, regular rhythm. Grossly normal heart sounds.  Good peripheral circulation. Respiratory: Normal respiratory effort.  No retractions. Lungs CTAB. Gastrointestinal: Soft and nontender. No distention. No abdominal bruits. No CVA tenderness. Rectal exam  with empty vault. Slight yellow liquid, heme negative. No evidence of bleeding. The colostomy bag in the right lower quadrant restraints formed brown stool, no evidence of bleeding. Musculoskeletal: No lower extremity tenderness nor edema.  No joint effusions. Neurologic:  Normal speech and language. No gross focal neurologic deficits are appreciated. Skin:  Skin is warm, dry and intact. No rash noted. Psychiatric: Mood and affect are normal. Speech and behavior are normal.  ____________________________________________   LABS (all labs ordered are listed, but only abnormal results are displayed)  Labs Reviewed  COMPREHENSIVE METABOLIC PANEL - Abnormal; Notable for the following:    Glucose, Bld 130 (*)    BUN 23 (*)    Calcium 8.8 (*)    ALT 14 (*)    All other components within normal limits  CBC - Abnormal; Notable for the following:    MCHC 31.8 (*)    RDW 14.9 (*)    All other components within normal limits  HEMOGLOBIN AND HEMATOCRIT, BLOOD  TYPE AND SCREEN  ABO/RH   ____________________________________________  EKG  Reviewed and interpreted by me Normal sinus rhythm, first-degree AV block Possible left ventricular hypertrophy ST elevation noted in V2, proximally 1 mm Q-wave seen anterior, QTc 440 QRS 100 PR 250  Compared with EKG from January of this year, no significant T-wave abnormalities are noted, right bundle branch block no longer noted. ____________________________________________  RADIOLOGY   ____________________________________________   PROCEDURES  Procedure(s) performed: None  Critical Care performed: No  ____________________________________________   INITIAL IMPRESSION / ASSESSMENT AND PLAN / ED COURSE  Pertinent labs & imaging results that were available during my care of the patient were reviewed by me and considered in my medical decision making (see chart for details).  Intermittent blood noted coming from the rectum for about 3  weeks. He is hemodynamically stable, he is actually hypertensive but reports that he is not taking blood pressure medicine today which she does have at home.  No abdominal pain, no chest pain or trouble breathing. His hemoglobin is stable, serial exams demonstrate thrill hemoglobin. We did discuss follow-up with his primary care doctor, careful return precautions including increased bleeding, lightheadedness, abdominal pain, and also following up with GI for which she is seeing Dr. Vira Agar in the past. He is agreeable all these. We did discuss his blood pressure, he indicates that he does not wish to stay to have medication given here but will take his medication when he gets home and continue to monitor his blood pressure through his primary. I think is agreeable. He'll follow-up closely with  ____________________________________________   FINAL CLINICAL IMPRESSION(S) / ED  DIAGNOSES  Final diagnoses:  Rectal bleed  Secondary hypertension, unspecified      Delman Kitten, MD 10/21/15 [phone]

## 2015-10-21 NOTE — ED Notes (Signed)
Pt has reports of rectal bleeding that began this am, has a colostomy no blood in stool but only from rectum. Denies pain.

## 2015-11-02 DIAGNOSIS — K601 Chronic anal fissure: Secondary | ICD-10-CM | POA: Diagnosis not present

## 2015-11-02 DIAGNOSIS — I10 Essential (primary) hypertension: Secondary | ICD-10-CM | POA: Diagnosis not present

## 2016-01-11 DIAGNOSIS — M25569 Pain in unspecified knee: Secondary | ICD-10-CM | POA: Diagnosis not present

## 2016-01-11 DIAGNOSIS — Z933 Colostomy status: Secondary | ICD-10-CM | POA: Diagnosis not present

## 2016-01-30 DIAGNOSIS — K625 Hemorrhage of anus and rectum: Secondary | ICD-10-CM | POA: Diagnosis not present

## 2016-01-30 DIAGNOSIS — C61 Malignant neoplasm of prostate: Secondary | ICD-10-CM | POA: Diagnosis not present

## 2016-01-30 DIAGNOSIS — I119 Hypertensive heart disease without heart failure: Secondary | ICD-10-CM | POA: Diagnosis not present

## 2016-04-02 DIAGNOSIS — J209 Acute bronchitis, unspecified: Secondary | ICD-10-CM | POA: Diagnosis not present

## 2016-04-02 DIAGNOSIS — C61 Malignant neoplasm of prostate: Secondary | ICD-10-CM | POA: Diagnosis not present

## 2016-04-02 DIAGNOSIS — I10 Essential (primary) hypertension: Secondary | ICD-10-CM | POA: Diagnosis not present

## 2016-04-04 ENCOUNTER — Encounter: Payer: Self-pay | Admitting: *Deleted

## 2016-04-09 ENCOUNTER — Encounter: Payer: Self-pay | Admitting: *Deleted

## 2016-04-11 DIAGNOSIS — Z933 Colostomy status: Secondary | ICD-10-CM | POA: Diagnosis not present

## 2016-04-11 DIAGNOSIS — M25569 Pain in unspecified knee: Secondary | ICD-10-CM | POA: Diagnosis not present

## 2016-04-16 ENCOUNTER — Ambulatory Visit (INDEPENDENT_AMBULATORY_CARE_PROVIDER_SITE_OTHER): Payer: Medicare Other | Admitting: General Surgery

## 2016-04-16 ENCOUNTER — Encounter: Payer: Self-pay | Admitting: General Surgery

## 2016-04-16 VITALS — BP 130/68 | HR 68 | Resp 16 | Ht 71.0 in | Wt 179.0 lb

## 2016-04-16 DIAGNOSIS — K625 Hemorrhage of anus and rectum: Secondary | ICD-10-CM | POA: Diagnosis not present

## 2016-04-16 NOTE — Progress Notes (Signed)
Patient ID: Gregory Valdez, male   DOB: 1915-10-17, 82 y.o.   MRN: [id number]  Chief Complaint  Patient presents with  . Other    rectal bleeding    HPI Gregory Valdez is a 82 y.o. male here today for a evaluation of rectal bleeding. He states the bleeding has been going for about 5-6 months and began following a prostate exam at his PCP. He states that he notices it as dark red and then it will lighten to bright red blood.  He describes it as tapering off every few days before it starting back up. Does not take any blood thinners. S/p colectomy and ileostomy. Personal history of radiation treatment for prostate cancer. I have reviewed the history of present illness with the patient.   HPI  Past Medical History  Diagnosis Date  . Hypertension   . Thyroid disease   . Cancer [facility])     Prostate cancer with XRT    Past Surgical History  Procedure Laterality Date  . Colon resection    . Eye surgery    . Eliostomy  1995  . Colonoscopy  1995    History reviewed. No pertinent family history.  Social History Social History  Substance Use Topics  . Smoking status: Never Smoker   . Smokeless tobacco: None  . Alcohol Use: No    Allergies  Allergen Reactions  . Sulfa Antibiotics     Current Outpatient Prescriptions  Medication Sig Dispense Refill  . cloNIDine (CATAPRES) 0.1 MG tablet Take 0.1 mg by mouth 2 (two) times daily.    Marland Kitchen gabapentin (NEURONTIN) 100 MG capsule Take 100 mg by mouth at bedtime.    Marland Kitchen levothyroxine (SYNTHROID, LEVOTHROID) 88 MCG tablet Take 88 mcg by mouth daily before breakfast.    . loratadine (CLARITIN) 10 MG tablet Take 10 mg by mouth daily.    . Multiple Vitamin (MULTI VITAMIN DAILY PO) Take by mouth.    . tamsulosin (FLOMAX) 0.4 MG CAPS capsule Take 0.4 mg by mouth.    . hydrocortisone (ANUSOL-HC) 25 MG suppository Place 25 mg rectally 2 (two) times daily.     No current facility-administered medications for this visit.    Review of  Systems Review of Systems  Constitutional: Negative.   Respiratory: Negative.   Cardiovascular: Negative.   Gastrointestinal: Positive for anal bleeding.    Blood pressure 130/68, pulse 68, resp. rate 16, height 5\' 11"  (1.803 m), weight 179 lb (81.194 kg).  Physical Exam Physical Exam  Constitutional: He is oriented to person, place, and time. He appears well-developed and well-nourished.  Eyes: Conjunctivae are normal. No scleral icterus.  Neck: Neck supple.  Cardiovascular: Normal rate and regular rhythm.   Abdominal: Soft. Bowel sounds are normal. There is no tenderness.    Genitourinary: Rectum normal. Rectal exam shows no external hemorrhoid, no mass and no tenderness.  External exam is normal, Internal digital exam shows narrow rectum. No palpable finding.  Neurological: He is alert and oriented to person, place, and time.  Skin: Skin is warm and dry.  Anoscopy was attempted, but anoscope could not be advanced past 1-2 cm. No apparent findings.   Data Reviewed Notes reviewed   Assessment    Rectal bleeding without any apparent cause, Digital exam is negative. Possible mild proctitis associated with prior radiation. At present, bleeding is not incapacitating in any way.     Plan   Advised no treatments at this time.  Patient is to follow-up with PCP. If  the bleeding worsens, call the clinic.       PCP:  Masoud This information has been scribed by Gaspar Cola CMA.    Alajiah Dutkiewicz G 04/16/2016, 11:57 AM

## 2016-04-16 NOTE — Patient Instructions (Signed)
Patient is to follow-up with PCP. If the bleeding worsens, call the clinic. If patient develops uncontrolled rectal bleeding, then call the 911 or go to the emergency department.

## 2016-04-22 DIAGNOSIS — M25569 Pain in unspecified knee: Secondary | ICD-10-CM | POA: Diagnosis not present

## 2016-04-22 DIAGNOSIS — Z933 Colostomy status: Secondary | ICD-10-CM | POA: Diagnosis not present

## 2016-05-28 DIAGNOSIS — J209 Acute bronchitis, unspecified: Secondary | ICD-10-CM | POA: Diagnosis not present

## 2016-05-28 DIAGNOSIS — C61 Malignant neoplasm of prostate: Secondary | ICD-10-CM | POA: Diagnosis not present

## 2016-05-28 DIAGNOSIS — I10 Essential (primary) hypertension: Secondary | ICD-10-CM | POA: Diagnosis not present

## 2016-05-28 DIAGNOSIS — R5381 Other malaise: Secondary | ICD-10-CM | POA: Diagnosis not present

## 2016-05-28 DIAGNOSIS — E784 Other hyperlipidemia: Secondary | ICD-10-CM | POA: Diagnosis not present

## 2016-06-04 DIAGNOSIS — I119 Hypertensive heart disease without heart failure: Secondary | ICD-10-CM | POA: Diagnosis not present

## 2016-06-04 DIAGNOSIS — C61 Malignant neoplasm of prostate: Secondary | ICD-10-CM | POA: Diagnosis not present

## 2016-07-04 DIAGNOSIS — K625 Hemorrhage of anus and rectum: Secondary | ICD-10-CM | POA: Diagnosis not present

## 2016-07-04 DIAGNOSIS — K629 Disease of anus and rectum, unspecified: Secondary | ICD-10-CM | POA: Diagnosis not present

## 2016-07-11 DIAGNOSIS — M25569 Pain in unspecified knee: Secondary | ICD-10-CM | POA: Diagnosis not present

## 2016-07-11 DIAGNOSIS — Z933 Colostomy status: Secondary | ICD-10-CM | POA: Diagnosis not present

## 2016-08-13 DIAGNOSIS — K6289 Other specified diseases of anus and rectum: Secondary | ICD-10-CM | POA: Insufficient documentation

## 2016-08-13 NOTE — Progress Notes (Signed)
[facility]  [phone] [phone]  ID: Gregory Valdez OB: [birth year]  MR#: [id number]  CSN#:[id number]  Patient Care Team: Gregory Athens, MD as PCP - General (Internal Medicine) Gregory Athens, MD (Internal Medicine) Gregory Robinette Haines, MD (General Surgery)  CHIEF COMPLAINT: Rectal mass  INTERVAL HISTORY: Patient is a 82 year old male who was noted to have persistent rectal bleeding over approximately the past year. Upon evaluation by GI, he did not wish to undergo morning colonoscopy or other invasive procedures including biopsy. He states he would likely not pursue any interventions, but would still "like to know what is going on". He otherwise feels well. He has no neurologic complaints. He denies any recent fevers or illnesses. He has a good appetite and denies weight loss. He has no chest pain or shortness of breath. He denies any nausea, vomiting, constipation, or diarrhea. He has no urinary complaints. Patient otherwise feels well and offers no further specific complaints.  REVIEW OF SYSTEMS:   Review of Systems  Constitutional: Negative.  Negative for fever, malaise/fatigue and weight loss.  Respiratory: Negative.  Negative for cough and shortness of breath.   Cardiovascular: Negative.  Negative for chest pain and leg swelling.  Gastrointestinal: Positive for blood in stool. Negative for abdominal pain and melena.  Genitourinary: Negative.   Musculoskeletal: Negative.   Neurological: Negative.  Negative for weakness.  Psychiatric/Behavioral: Negative.  The patient is not nervous/anxious.     As per HPI. Otherwise, a complete review of systems is negative.  PAST MEDICAL HISTORY: Past Medical History:  Diagnosis Date  . Cancer [facility])    Prostate cancer with XRT  . Cataract   . Hypertension   . Neuropathy ([place])   . Thyroid disease     PAST SURGICAL HISTORY: Past Surgical History:  Procedure Laterality Date  . COLON RESECTION      . COLONOSCOPY  1995  . eliostomy  1995  . EYE SURGERY    . HERNIA REPAIR    . PROSTATE SURGERY      FAMILY HISTORY: History reviewed. No pertinent family history.  ADVANCED DIRECTIVES (Y/N):  N  HEALTH MAINTENANCE: Social History  Substance Use Topics  . Smoking status: Former Smoker    Years: 30.00  . Smokeless tobacco: Never Used  . Alcohol use No     Colonoscopy:  PAP:  Bone density:  Lipid panel:  Allergies  Allergen Reactions  . Sulfa Antibiotics     Current Outpatient Prescriptions  Medication Sig Dispense Refill  . cloNIDine (CATAPRES) 0.1 MG tablet Take 0.1 mg by mouth 2 (two) times daily. Take 1 in the morning and 2 in the evening.    . gabapentin (NEURONTIN) 100 MG capsule Take 100 mg by mouth at bedtime.    Marland Kitchen ibuprofen (ADVIL,MOTRIN) 200 MG tablet Take 200 mg by mouth every 6 (six) hours as needed.    Marland Kitchen levothyroxine (SYNTHROID, LEVOTHROID) 88 MCG tablet Take 88 mcg by mouth daily before breakfast.    . losartan-hydrochlorothiazide (HYZAAR) 50-12.5 MG tablet Take 1 tablet by mouth daily.    . Multiple Vitamin (MULTI VITAMIN DAILY PO) Take 1 tablet by mouth daily.     . ranitidine (ZANTAC) 150 MG tablet Take 150 mg by mouth as needed for heartburn.    . tamsulosin (FLOMAX) 0.4 MG CAPS capsule Take 0.4 mg by mouth.     No current facility-administered medications for this visit.     OBJECTIVE: Vitals:   08/14/16 1418  Resp: 18  Temp: 99.2 F (37.3 C)     Body mass index is 23.45 kg/m.    ECOG FS:0 - Asymptomatic  General: Well-developed, well-nourished, no acute distress. Eyes: Pink conjunctiva, anicteric sclera. HEENT: Normocephalic, moist mucous membranes, clear oropharnyx. Lungs: Clear to auscultation bilaterally. Heart: Regular rate and rhythm. No rubs, murmurs, or gallops. Abdomen: Soft, nontender, nondistended. No organomegaly noted, normoactive bowel sounds. Musculoskeletal: No edema, cyanosis, or clubbing. Neuro: Alert, answering all  questions appropriately. Cranial nerves grossly intact. Skin: No rashes or petechiae noted. Psych: Normal affect. Lymphatics: No cervical, calvicular, axillary or inguinal LAD.   LAB RESULTS:  Lab Results  Component Value Date   NA 138 08/14/2016   K 3.5 08/14/2016   CL 103 08/14/2016   CO2 26 08/14/2016   GLUCOSE 138 (H) 08/14/2016   BUN 23 (H) 08/14/2016   CREATININE 1.00 08/14/2016   CALCIUM 9.0 08/14/2016   PROT 6.8 08/14/2016   ALBUMIN 3.9 08/14/2016   AST 20 08/14/2016   ALT 13 (L) 08/14/2016   ALKPHOS 53 08/14/2016   BILITOT 0.7 08/14/2016   GFRNONAA 60 (L) 08/14/2016   GFRAA >60 08/14/2016    Lab Results  Component Value Date   WBC 9.9 08/14/2016   NEUTROABS 7.4 (H) 08/14/2016   HGB 13.9 08/14/2016   HCT 41.5 08/14/2016   MCV 86.5 08/14/2016   PLT 250 08/14/2016   Lab Results  Component Value Date   CEA 45.3 (H) 08/14/2016     STUDIES: No results found.  ASSESSMENT: Rectal mass  PLAN:    1. Rectal mass: Highly suspicious for underlying malignancy. His CEA is significantly elevated at 45.3. He has already indicated given his advanced age that he would not want to undergo any invasive procedures, including biopsy. He has agreed to pursue a CT scan for further evaluation and to assess the extent of his probable malignancy. Return to clinic in 1 week to discuss the results and further evaluation.  Approximately 35 minutes is spent in discussion of which greater than 50% was consultation.  Patient expressed understanding and was in agreement with this plan. He also understands that He can call clinic at any time with any questions, concerns, or complaints.   No matching staging information was found for the patient.  Gregory Huger, MD   08/19/2016 8:31 AM

## 2016-08-14 ENCOUNTER — Encounter (INDEPENDENT_AMBULATORY_CARE_PROVIDER_SITE_OTHER): Payer: Self-pay

## 2016-08-14 ENCOUNTER — Inpatient Hospital Stay: Payer: Medicare Other

## 2016-08-14 ENCOUNTER — Inpatient Hospital Stay: Payer: Medicare Other | Attending: Oncology | Admitting: Oncology

## 2016-08-14 ENCOUNTER — Encounter: Payer: Self-pay | Admitting: Oncology

## 2016-08-14 VITALS — Temp 99.2°F | Resp 18 | Wt 168.1 lb

## 2016-08-14 DIAGNOSIS — K629 Disease of anus and rectum, unspecified: Secondary | ICD-10-CM | POA: Diagnosis not present

## 2016-08-14 DIAGNOSIS — E079 Disorder of thyroid, unspecified: Secondary | ICD-10-CM | POA: Diagnosis not present

## 2016-08-14 DIAGNOSIS — I10 Essential (primary) hypertension: Secondary | ICD-10-CM | POA: Diagnosis not present

## 2016-08-14 DIAGNOSIS — Z923 Personal history of irradiation: Secondary | ICD-10-CM | POA: Insufficient documentation

## 2016-08-14 DIAGNOSIS — K6289 Other specified diseases of anus and rectum: Secondary | ICD-10-CM

## 2016-08-14 DIAGNOSIS — Z87891 Personal history of nicotine dependence: Secondary | ICD-10-CM | POA: Diagnosis not present

## 2016-08-14 DIAGNOSIS — R970 Elevated carcinoembryonic antigen [CEA]: Secondary | ICD-10-CM | POA: Insufficient documentation

## 2016-08-14 DIAGNOSIS — Z8546 Personal history of malignant neoplasm of prostate: Secondary | ICD-10-CM | POA: Diagnosis not present

## 2016-08-14 DIAGNOSIS — Z79899 Other long term (current) drug therapy: Secondary | ICD-10-CM | POA: Diagnosis not present

## 2016-08-14 LAB — CBC WITH DIFFERENTIAL/PLATELET
Basophils Absolute: 0.1 10*3/uL (ref 0–0.1)
Basophils Relative: 1 %
EOS PCT: 1 %
Eosinophils Absolute: 0.1 10*3/uL (ref 0–0.7)
HCT: 41.5 % (ref 40.0–52.0)
Hemoglobin: 13.9 g/dL (ref 13.0–18.0)
LYMPHS ABS: 1.4 10*3/uL (ref 1.0–3.6)
LYMPHS PCT: 14 %
MCH: 28.9 pg (ref 26.0–34.0)
MCHC: 33.4 g/dL (ref 32.0–36.0)
MCV: 86.5 fL (ref 80.0–100.0)
MONO ABS: 1 10*3/uL (ref 0.2–1.0)
Monocytes Relative: 10 %
Neutro Abs: 7.4 10*3/uL — ABNORMAL HIGH (ref 1.4–6.5)
Neutrophils Relative %: 74 %
Platelets: 250 10*3/uL (ref 150–440)
RBC: 4.8 MIL/uL (ref 4.40–5.90)
RDW: 15.5 % — AB (ref 11.5–14.5)
WBC: 9.9 10*3/uL (ref 3.8–10.6)

## 2016-08-14 LAB — COMPREHENSIVE METABOLIC PANEL WITH GFR
ALT: 13 U/L — ABNORMAL LOW (ref 17–63)
AST: 20 U/L (ref 15–41)
Albumin: 3.9 g/dL (ref 3.5–5.0)
Alkaline Phosphatase: 53 U/L (ref 38–126)
Anion gap: 9 (ref 5–15)
BUN: 23 mg/dL — ABNORMAL HIGH (ref 6–20)
CO2: 26 mmol/L (ref 22–32)
Calcium: 9 mg/dL (ref 8.9–10.3)
Chloride: 103 mmol/L (ref 101–111)
Creatinine, Ser: 1 mg/dL (ref 0.61–1.24)
GFR calc Af Amer: >60 mL/min
GFR calc non Af Amer: 60 mL/min — ABNORMAL LOW
Glucose, Bld: 138 mg/dL — ABNORMAL HIGH (ref 65–99)
Potassium: 3.5 mmol/L (ref 3.5–5.1)
Sodium: 138 mmol/L (ref 135–145)
Total Bilirubin: 0.7 mg/dL (ref 0.3–1.2)
Total Protein: 6.8 g/dL (ref 6.5–8.1)

## 2016-08-14 LAB — PSA: PSA: 0.99 ng/mL (ref 0.00–4.00)

## 2016-08-14 NOTE — Progress Notes (Signed)
New evaluation for rectal mass. Complains of neuropathy in both feet.

## 2016-08-15 LAB — CANCER ANTIGEN 19-9: CA 19-9: 53 U/mL — ABNORMAL HIGH (ref 0–35)

## 2016-08-15 LAB — CEA: CEA: 45.3 ng/mL — ABNORMAL HIGH (ref 0.0–4.7)

## 2016-08-21 ENCOUNTER — Ambulatory Visit
Admission: RE | Admit: 2016-08-21 | Discharge: 2016-08-21 | Disposition: A | Discharge status: Home / Self Care | Payer: Medicare Other | Source: Ambulatory Visit | Attending: Oncology | Admitting: Oncology

## 2016-08-21 DIAGNOSIS — K435 Parastomal hernia without obstruction or  gangrene: Secondary | ICD-10-CM | POA: Insufficient documentation

## 2016-08-21 DIAGNOSIS — K629 Disease of anus and rectum, unspecified: Secondary | ICD-10-CM | POA: Insufficient documentation

## 2016-08-21 DIAGNOSIS — R1909 Other intra-abdominal and pelvic swelling, mass and lump: Secondary | ICD-10-CM | POA: Diagnosis not present

## 2016-08-21 DIAGNOSIS — K6289 Other specified diseases of anus and rectum: Secondary | ICD-10-CM

## 2016-08-21 DIAGNOSIS — Z9049 Acquired absence of other specified parts of digestive tract: Secondary | ICD-10-CM | POA: Insufficient documentation

## 2016-08-21 DIAGNOSIS — I700 Atherosclerosis of aorta: Secondary | ICD-10-CM | POA: Insufficient documentation

## 2016-08-21 MED ORDER — IOPAMIDOL (ISOVUE-300) INJECTION 61%
85.0000 mL | Freq: Once | INTRAVENOUS | Status: AC | PRN
  Administered 2016-08-21: 85 mL via INTRAVENOUS

## 2016-08-28 ENCOUNTER — Telehealth: Payer: Self-pay | Admitting: *Deleted

## 2016-08-28 ENCOUNTER — Emergency Department
Admission: EM | Admit: 2016-08-28 | Discharge: 2016-08-28 | Disposition: A | Discharge status: Home / Self Care | Payer: Medicare Other | Attending: Emergency Medicine | Admitting: Emergency Medicine

## 2016-08-28 ENCOUNTER — Encounter: Payer: Self-pay | Admitting: Emergency Medicine

## 2016-08-28 DIAGNOSIS — Z79899 Other long term (current) drug therapy: Secondary | ICD-10-CM | POA: Diagnosis not present

## 2016-08-28 DIAGNOSIS — Z791 Long term (current) use of non-steroidal anti-inflammatories (NSAID): Secondary | ICD-10-CM | POA: Insufficient documentation

## 2016-08-28 DIAGNOSIS — Z8546 Personal history of malignant neoplasm of prostate: Secondary | ICD-10-CM | POA: Insufficient documentation

## 2016-08-28 DIAGNOSIS — Z87891 Personal history of nicotine dependence: Secondary | ICD-10-CM | POA: Insufficient documentation

## 2016-08-28 DIAGNOSIS — K625 Hemorrhage of anus and rectum: Secondary | ICD-10-CM | POA: Insufficient documentation

## 2016-08-28 DIAGNOSIS — K649 Unspecified hemorrhoids: Secondary | ICD-10-CM | POA: Insufficient documentation

## 2016-08-28 DIAGNOSIS — I10 Essential (primary) hypertension: Secondary | ICD-10-CM | POA: Diagnosis not present

## 2016-08-28 LAB — CBC WITH DIFFERENTIAL/PLATELET
BASOS ABS: 0.1 10*3/uL (ref 0–0.1)
Basophils Relative: 1 %
Eosinophils Absolute: 0.1 10*3/uL (ref 0–0.7)
Eosinophils Relative: 1 %
HEMATOCRIT: 39.2 % — AB (ref 40.0–52.0)
HEMOGLOBIN: 13.2 g/dL (ref 13.0–18.0)
LYMPHS PCT: 12 %
Lymphs Abs: 1 10*3/uL (ref 1.0–3.6)
MCH: 29.4 pg (ref 26.0–34.0)
MCHC: 33.7 g/dL (ref 32.0–36.0)
MCV: 87.2 fL (ref 80.0–100.0)
Monocytes Absolute: 0.7 10*3/uL (ref 0.2–1.0)
Monocytes Relative: 9 %
NEUTROS ABS: 6.2 10*3/uL (ref 1.4–6.5)
NEUTROS PCT: 77 %
Platelets: 204 10*3/uL (ref 150–440)
RBC: 4.49 MIL/uL (ref 4.40–5.90)
RDW: 15.7 % — ABNORMAL HIGH (ref 11.5–14.5)
WBC: 8 10*3/uL (ref 3.8–10.6)

## 2016-08-28 LAB — COMPREHENSIVE METABOLIC PANEL
ALBUMIN: 3.4 g/dL — AB (ref 3.5–5.0)
ALT: 13 U/L — ABNORMAL LOW (ref 17–63)
ANION GAP: 7 (ref 5–15)
AST: 24 U/L (ref 15–41)
Alkaline Phosphatase: 47 U/L (ref 38–126)
BILIRUBIN TOTAL: 0.7 mg/dL (ref 0.3–1.2)
BUN: 26 mg/dL — ABNORMAL HIGH (ref 6–20)
CO2: 28 mmol/L (ref 22–32)
Calcium: 8.9 mg/dL (ref 8.9–10.3)
Chloride: 105 mmol/L (ref 101–111)
Creatinine, Ser: 1.09 mg/dL (ref 0.61–1.24)
GFR calc Af Amer: >60 mL/min (ref >60)
GFR, EST NON AFRICAN AMERICAN: 54 mL/min — AB (ref >60)
GLUCOSE: 203 mg/dL — AB (ref 65–99)
POTASSIUM: 3.3 mmol/L — AB (ref 3.5–5.1)
Sodium: 140 mmol/L (ref 135–145)
TOTAL PROTEIN: 6.2 g/dL — AB (ref 6.5–8.1)

## 2016-08-28 LAB — APTT: aPTT: 29 seconds (ref 24–36)

## 2016-08-28 LAB — PROTIME-INR
INR: 1.02
Prothrombin Time: 13.4 seconds (ref 11.4–15.2)

## 2016-08-28 MED ORDER — "TRANEXAMIC ACID 5% ORAL SOLUTION "
10.0000 mL | Freq: Once | ORAL | Status: AC
  Administered 2016-08-28: 10 mL via OROMUCOSAL
  Filled 2016-08-28: qty 10

## 2016-08-28 NOTE — ED Triage Notes (Signed)
Pt presents to ED via EMS from home c/o rectal bleeding. Pt states bleeding "has been going on about a year now" but was worse this morning. Hx hemorrhoids and RLQ colostomy placement.

## 2016-08-28 NOTE — Telephone Encounter (Signed)
Pt's daughter called to inform our office that the patient started to have rectal bleeding but were able to stop it at this time. Family has already spoken with Dr. Percell [place] office regarding rectal bleeding. Left voicemail with daughter that if rectal bleeding returns and they are unable to control it then they would need to take him to ED for evaluation. Instructed pt's daughter to call back if has any further questions.

## 2016-08-28 NOTE — ED Triage Notes (Signed)
Pt denies bleeding in colostomy bag, states he had CT and bloodwork earlier this week with oncologist while being evaluated for prostate CA.

## 2016-08-28 NOTE — ED Provider Notes (Signed)
[facility] Emergency Department Provider Note  ____________________________________________  Time seen: Approximately 2:23 PM  I have reviewed the triage vital signs and the nursing notes.   HISTORY  Chief Complaint Rectal Bleeding    HPI Gregory Valdez is a 82 y.o. male who complains of chronic rectal bleeding over the past year. This morning and felt like he was a little bit worse. It's red blood. Denies melena. In fact, he has had a colostomy for the past 20+ years after a total colectomy. No change in ostomy output. No black or bloody ostomy output. Eating and drinking normally. No vomiting. No belly pain.  He had been referred to Dr. Grayland Ormond for cancer workup. He underwent a CT scan of the abdomen and pelvis awake ago, he is not aware of the results at this point yet. He follows up with Dr. Grayland Ormond in 5 days on Monday.  He has been managing the bleeding with a cotton ball placed over the anus.     Past Medical History:  Diagnosis Date  . Cancer [facility])    Prostate cancer with XRT  . Cataract   . Hypertension   . Neuropathy ([place])   . Thyroid disease      Patient Active Problem List   Diagnosis Date Noted  . Rectal mass 08/13/2016     Past Surgical History:  Procedure Laterality Date  . COLON RESECTION    . COLONOSCOPY  1995  . eliostomy  1995  . EYE SURGERY    . HERNIA REPAIR    . PROSTATE SURGERY       Prior to Admission medications   Medication Sig Start Date End Date Taking? Authorizing Provider  cloNIDine (CATAPRES) 0.1 MG tablet Take 0.1 mg by mouth 2 (two) times daily. Take 1 in the morning and 2 in the evening.    Historical Provider, MD  gabapentin (NEURONTIN) 100 MG capsule Take 100 mg by mouth at bedtime.    Historical Provider, MD  ibuprofen (ADVIL,MOTRIN) 200 MG tablet Take 200 mg by mouth every 6 (six) hours as needed.    Historical Provider, MD  levothyroxine (SYNTHROID, LEVOTHROID) 88 MCG tablet Take 88 mcg by  mouth daily before breakfast.    Historical Provider, MD  losartan-hydrochlorothiazide (HYZAAR) 50-12.5 MG tablet Take 1 tablet by mouth daily.    Historical Provider, MD  Multiple Vitamin (MULTI VITAMIN DAILY PO) Take 1 tablet by mouth daily.     Historical Provider, MD  ranitidine (ZANTAC) 150 MG tablet Take 150 mg by mouth as needed for heartburn.    Historical Provider, MD  tamsulosin (FLOMAX) 0.4 MG CAPS capsule Take 0.4 mg by mouth.    Historical Provider, MD     Allergies Sulfa antibiotics   History reviewed. No pertinent family history.  Social History Social History  Substance Use Topics  . Smoking status: Former Smoker    Years: 30.00  . Smokeless tobacco: Never Used  . Alcohol use No    Review of Systems  Constitutional:   No fever or chills.   Cardiovascular:   No chest pain. Respiratory:   No dyspnea or cough. Gastrointestinal:   Negative for abdominal pain, vomiting . Positive chronic rectal bleeding. Chronic colostomy. Genitourinary:   Negative for dysuria or difficulty urinating. Neurological:   Negative for headaches 10-point ROS otherwise negative.  ____________________________________________   PHYSICAL EXAM:  VITAL SIGNS: ED Triage Vitals  Enc Vitals Group     BP 08/28/16 1248 (!) 161/82  Pulse Rate 08/28/16 1248 92     Resp 08/28/16 1248 18     Temp 08/28/16 1248 98.7 F (37.1 C)     Temp Source 08/28/16 1248 Oral     SpO2 08/28/16 1248 100 %     Weight 08/28/16 1248 168 lb (76.2 kg)     Height 08/28/16 1248 5' 11.5" (1.816 m)     Head Circumference --      Peak Flow --      Pain Score 08/28/16 1249 0     Pain Loc --      Pain Edu? --      Excl. in [place]? --     Vital signs reviewed, nursing assessments reviewed.   Constitutional:   Alert and oriented. Well appearing and in no distress. Eyes:   No scleral icterus. No conjunctival pallor. PERRL. EOMI.  No nystagmus. ENT   Head:   Normocephalic and atraumatic.   Nose:   No  congestion/rhinnorhea. No septal hematoma   Mouth/Throat:   MMM, no pharyngeal erythema. No peritonsillar mass.    Neck:   No stridor. No SubQ emphysema. No meningismus. Hematological/Lymphatic/Immunilogical:   No cervical lymphadenopathy. Cardiovascular:   RRR. Symmetric bilateral radial and DP pulses.  No murmurs.  Respiratory:   Normal respiratory effort without tachypnea nor retractions. Breath sounds are clear and equal bilaterally. No wheezes/rales/rhonchi. Gastrointestinal:   Soft and nontender. Non distended. There is no CVA tenderness.  No rebound, rigidity, or guarding.Rectal exam reveals palpable stricture of the anus. Palpable hemorrhoids. There is a palpable soft tissue mass which feels friable. There is red blood which is  Hemoccult positive. Genitourinary:   Normal  Musculoskeletal:   Nontender with normal range of motion in all extremities. No joint effusions.  No lower extremity tenderness.  No edema. Neurologic:   Normal speech and language.  CN 2-10 normal. Motor grossly intact. No gross focal neurologic deficits are appreciated.  Skin:    Skin is warm, dry and intact. No rash noted.  No petechiae, purpura, or bullae.  ____________________________________________    LABS (pertinent positives/negatives) (all labs ordered are listed, but only abnormal results are displayed) Labs Reviewed  COMPREHENSIVE METABOLIC PANEL - Abnormal; Notable for the following:       Result Value   Potassium 3.3 (*)    Glucose, Bld 203 (*)    BUN 26 (*)    Total Protein 6.2 (*)    Albumin 3.4 (*)    ALT 13 (*)    GFR calc non Af Amer 54 (*)    All other components within normal limits  CBC WITH DIFFERENTIAL/PLATELET - Abnormal; Notable for the following:    HCT 39.2 (*)    RDW 15.7 (*)    All other components within normal limits  PROTIME-INR  APTT   ____________________________________________   EKG    ____________________________________________     RADIOLOGY    ____________________________________________   PROCEDURES Procedures Control of bleeding Liquid solution tranexamic acid soaked into 4 x 4 gauze that was unrolled into a long strip. This was gently inserted into the anus and rectum leading a tail in the gluteal cleft. Dry gauze was placed over this. ____________________________________________   INITIAL IMPRESSION / ASSESSMENT AND PLAN / ED COURSE  Pertinent labs & imaging results that were available during my care of the patient were reviewed by me and considered in my medical decision making (see chart for details).  Patient well appearing no acute distress. Presents with chronic rectal bleeding.  Review of CT scan from a week ago shows that he has a soft tissue mass in the area that is concerning for neoplasm, likely causing the bleeding, versus hemorrhoids. After examination the bleeding had somewhat increased. His hemoglobin is particularly stable and he has no signs or symptoms of anemia. Vital signs unremarkable. For his comfort and to slow down any bleeding that may be due to direct digital examination, TXA was placed in the rectum. Patient instructed to remove the gauze in about one hour. Follow-up with Dr. Grayland Ormond as scheduled. Return precautions given.  I had discussed various workup and management options with the patient including gastroenterology evaluation or surgical evaluation. Patient is not interested in cancer treatment if it is cancer and does not wish to have any surgery and all this can be deferred to outpatient follow-up.     Clinical Course   ____________________________________________   FINAL CLINICAL IMPRESSION(S) / ED DIAGNOSES  Final diagnoses:  Rectal bleeding       Portions of this note were generated with dragon dictation software. Dictation errors may occur despite best attempts at proofreading.    Carrie Mew, MD 08/28/16 [phone]

## 2016-08-28 NOTE — ED Notes (Signed)
Rectal hemoccult positive per EDP.

## 2016-09-01 NOTE — Progress Notes (Signed)
[place]  Telephone:(336) [phone] Fax:(336) [phone]  ID: Gregory Valdez OB: 1915/05/07  MR#: [id number]  [id number]  Patient Care Team: Cletis Athens, MD as PCP - General (Internal Medicine) Cletis Athens, MD (Internal Medicine) Seeplaputhur Robinette Haines, MD (General Surgery)  CHIEF COMPLAINT: Rectal mass, highly suspicious for malignancy.  INTERVAL HISTORY: Patient returns to clinic today for further evaluation and discussion of his imaging results. He continues to have occasional rectal bleeding, but otherwise feels well. He denies any pain. He has no neurologic complaints. He denies any recent fevers or illnesses. He has a good appetite and denies weight loss. He has no chest pain or shortness of breath. He denies any nausea, vomiting, constipation, or diarrhea. He has no urinary complaints. Patient otherwise feels well and offers no further specific complaints.  REVIEW OF SYSTEMS:   Review of Systems  Constitutional: Negative.  Negative for fever, malaise/fatigue and weight loss.  Respiratory: Negative.  Negative for cough and shortness of breath.   Cardiovascular: Negative.  Negative for chest pain and leg swelling.  Gastrointestinal: Positive for blood in stool. Negative for abdominal pain and melena.  Genitourinary: Negative.   Musculoskeletal: Negative.   Neurological: Negative.  Negative for weakness.  Psychiatric/Behavioral: Negative.  The patient is not nervous/anxious.     As per HPI. Otherwise, a complete review of systems is negative.  PAST MEDICAL HISTORY: Past Medical History:  Diagnosis Date  . Cancer [facility])    Prostate cancer with XRT  . Cataract   . Hypertension   . Neuropathy ([place])   . Thyroid disease     PAST SURGICAL HISTORY: Past Surgical History:  Procedure Laterality Date  . COLON RESECTION    . COLONOSCOPY  1995  . eliostomy  1995  . EYE SURGERY    . HERNIA REPAIR    . PROSTATE SURGERY      FAMILY HISTORY: No family  history on file.  ADVANCED DIRECTIVES (Y/N):  N  HEALTH MAINTENANCE: Social History  Substance Use Topics  . Smoking status: Former Smoker    Years: 30.00  . Smokeless tobacco: Never Used  . Alcohol use No     Colonoscopy:  PAP:  Bone density:  Lipid panel:  Allergies  Allergen Reactions  . Sulfa Antibiotics     Current Outpatient Prescriptions  Medication Sig Dispense Refill  . cloNIDine (CATAPRES) 0.1 MG tablet Take 0.1 mg by mouth 2 (two) times daily. Take 1 in the morning and 2 in the evening.    . gabapentin (NEURONTIN) 100 MG capsule Take 100 mg by mouth at bedtime.    Marland Kitchen ibuprofen (ADVIL,MOTRIN) 200 MG tablet Take 200 mg by mouth every 6 (six) hours as needed.    Marland Kitchen levothyroxine (SYNTHROID, LEVOTHROID) 88 MCG tablet Take 88 mcg by mouth daily before breakfast.    . losartan-hydrochlorothiazide (HYZAAR) 50-12.5 MG tablet Take 1 tablet by mouth daily.    . Multiple Vitamin (MULTI VITAMIN DAILY PO) Take 1 tablet by mouth daily.     . ranitidine (ZANTAC) 150 MG tablet Take 150 mg by mouth as needed for heartburn.    . tamsulosin (FLOMAX) 0.4 MG CAPS capsule Take 0.4 mg by mouth.     Current Facility-Administered Medications  Medication Dose Route Frequency Provider Last Rate Last Dose  . Influenza vac split quadrivalent PF (FLUARIX) injection 0.5 mL  0.5 mL Intramuscular Once Lloyd Huger, MD        OBJECTIVE: Vitals:   09/02/16 1438  BP: Marland Kitchen)  201/89  Pulse: 97  Resp: 18  Temp: 98.6 F (37 C)     Body mass index is 23.1 kg/m.    ECOG FS:0 - Asymptomatic  General: Well-developed, well-nourished, no acute distress. Eyes: Pink conjunctiva, anicteric sclera. Lungs: Clear to auscultation bilaterally. Heart: Regular rate and rhythm. No rubs, murmurs, or gallops. Abdomen: Soft, nontender, nondistended. No organomegaly noted, normoactive bowel sounds. Musculoskeletal: No edema, cyanosis, or clubbing. Neuro: Alert, answering all questions appropriately. Cranial  nerves grossly intact. Skin: No rashes or petechiae noted. Psych: Normal affect.   LAB RESULTS:  Lab Results  Component Value Date   NA 140 08/28/2016   K 3.3 (L) 08/28/2016   CL 105 08/28/2016   CO2 28 08/28/2016   GLUCOSE 203 (H) 08/28/2016   BUN 26 (H) 08/28/2016   CREATININE 1.09 08/28/2016   CALCIUM 8.9 08/28/2016   PROT 6.2 (L) 08/28/2016   ALBUMIN 3.4 (L) 08/28/2016   AST 24 08/28/2016   ALT 13 (L) 08/28/2016   ALKPHOS 47 08/28/2016   BILITOT 0.7 08/28/2016   GFRNONAA 54 (L) 08/28/2016   GFRAA >60 08/28/2016    Lab Results  Component Value Date   WBC 8.0 08/28/2016   NEUTROABS 6.2 08/28/2016   HGB 13.2 08/28/2016   HCT 39.2 (L) 08/28/2016   MCV 87.2 08/28/2016   PLT 204 08/28/2016   Lab Results  Component Value Date   CEA 45.3 (H) 08/14/2016   Lab Results  Component Value Date   PSA 0.99 08/14/2016   Lab Results  Component Value Date   CA199 53 (H) 08/14/2016     STUDIES: Ct Abdomen Pelvis W Contrast  Result Date: 08/21/2016 CLINICAL DATA:  82 year old male with history of intermittent rectal bleeding for 1 year and some pelvic discomfort. History prostate cancer. EXAM: CT ABDOMEN AND PELVIS WITH CONTRAST TECHNIQUE: Multidetector CT imaging of the abdomen and pelvis was performed using the standard protocol following bolus administration of intravenous contrast. CONTRAST:  20mL ISOVUE-300 IOPAMIDOL (ISOVUE-300) INJECTION 61% COMPARISON:  CT the abdomen and pelvis 07/01/2015. FINDINGS: Lower chest: Aortic atherosclerosis. Small bilateral Bochdalek's hernias (left greater than right). Hepatobiliary: No cystic or solid hepatic lesions. No intra or extrahepatic biliary ductal dilatation. Gallbladder is normal in appearance. Pancreas: No pancreatic mass. No pancreatic ductal dilatation. No pancreatic or peripancreatic fluid or inflammatory changes. Spleen: Unremarkable. Adrenals/Urinary Tract: Low-attenuation lesions in the lower pole of the right kidney are  compatible with simple cysts, largest of which measures 2.6 cm. Left kidney and bilateral adrenal glands are normal in appearance. There is no hydroureteronephrosis. Urinary bladder is normal in appearance. Stomach/Bowel: Normal appearance of the stomach. 2 large duodenal diverticulae from the third portion of the duodenum incidentally noted. No surrounding inflammatory changes. Status post total colectomy with Hartmann's pouch and right lower quadrant ileostomy, with large parastomal hernia, similar to the prior examination from 07/01/2015. In the distal Hartmann's pouch immediately above the anus there is extensive mass-like soft tissue which measures approximately 3.6 x 3.8 x 6.4 cm (axial image 73 of series 2 and sagittal image 70 of series 6), concerning for anorectal neoplasm. Haziness in the adjacent mesorectal fat. Prominent but nonenlarged right-sided 7 mm short axis mesorectal lymph node (image 68 of series 2). Vascular/Lymphatic: Aortic atherosclerosis, without evidence of aneurysm or dissection in the abdominal or pelvic vasculature. No lymphadenopathy noted in the abdomen or pelvis. Reproductive: Prostate gland and seminal vesicles are unremarkable in appearance. Other: No significant volume of ascites.  No pneumoperitoneum. Musculoskeletal: Chronic compression fracture  of superior endplate of L1 and L2, most severe at L2 where there is 30% loss of anterior vertebral body height. There are no aggressive appearing lytic or blastic lesions noted in the visualized portions of the skeleton. IMPRESSION: 1. Mass-like soft tissue in a distal rectum immediately above the anus within the patient's Hartmann's pouch, highly concerning for anorectal neoplasm. Correlation with physical examination and anoscopy is recommended in the near future. Slight haziness in the adjacent mesorectal fat and borderline enlarged right-sided mesorectal lymph node. 2. Status post total colectomy with right lower quadrant ileostomy,  with large parastomal hernia. 3. Aortic atherosclerosis. 4. Additional incidental findings, as above. Electronically Signed   By: Vinnie Langton M.D.   On: 08/21/2016 14:41    ASSESSMENT: Rectal mass  PLAN:    1. Rectal mass: Highly suspicious for underlying malignancy. CT scan results reviewed independently and reported as above. His CEA is significantly elevated at 45.3. He has repeated that given his advanced age that he would not want to undergo any invasive procedures, including biopsy. He does not wish to pursue surgical consult, but has stated he will call clinic if he changes his mind. He is not having any pain, but is concerned about this and a referral was given to outpatient palliative care for in-home nurse practitioner evaluation. No further follow-up has been scheduled.   Patient expressed understanding and was in agreement with this plan. He also understands that He can call clinic at any time with any questions, concerns, or complaints.    Lloyd Huger, MD   09/02/2016 3:17 PM

## 2016-09-02 ENCOUNTER — Inpatient Hospital Stay (HOSPITAL_BASED_OUTPATIENT_CLINIC_OR_DEPARTMENT_OTHER): Payer: Medicare Other | Admitting: Oncology

## 2016-09-02 VITALS — BP 201/89 | HR 97 | Temp 98.6°F | Resp 18 | Wt 168.0 lb

## 2016-09-02 DIAGNOSIS — K625 Hemorrhage of anus and rectum: Secondary | ICD-10-CM | POA: Diagnosis not present

## 2016-09-02 DIAGNOSIS — Z87891 Personal history of nicotine dependence: Secondary | ICD-10-CM

## 2016-09-02 DIAGNOSIS — Z8546 Personal history of malignant neoplasm of prostate: Secondary | ICD-10-CM

## 2016-09-02 DIAGNOSIS — R970 Elevated carcinoembryonic antigen [CEA]: Secondary | ICD-10-CM

## 2016-09-02 DIAGNOSIS — E079 Disorder of thyroid, unspecified: Secondary | ICD-10-CM | POA: Diagnosis not present

## 2016-09-02 DIAGNOSIS — Z923 Personal history of irradiation: Secondary | ICD-10-CM | POA: Diagnosis not present

## 2016-09-02 DIAGNOSIS — K629 Disease of anus and rectum, unspecified: Secondary | ICD-10-CM | POA: Diagnosis not present

## 2016-09-02 DIAGNOSIS — K6289 Other specified diseases of anus and rectum: Secondary | ICD-10-CM

## 2016-09-02 DIAGNOSIS — I10 Essential (primary) hypertension: Secondary | ICD-10-CM | POA: Diagnosis not present

## 2016-09-02 DIAGNOSIS — Z23 Encounter for immunization: Secondary | ICD-10-CM

## 2016-09-02 DIAGNOSIS — Z79899 Other long term (current) drug therapy: Secondary | ICD-10-CM | POA: Diagnosis not present

## 2016-09-02 MED ORDER — INFLUENZA VAC SPLIT QUAD 0.5 ML IM SUSY: 0.5000 mL | PREFILLED_SYRINGE | Freq: Once | INTRAMUSCULAR | Status: DC

## 2016-09-02 NOTE — Progress Notes (Signed)
States is feeling weak and tired today. Continues to have chronic neuropathy in foot.

## 2016-11-10 IMAGING — CT CT ABD-PELV W/ CM
1 of 3 series · 13 of 32 positions shown, 18 images · IV contrast (APPLIED)
Comparison: CT the abdomen and pelvis 07/01/2015.

CLINICAL DATA: [AGE] male with history of intermittent rectal
bleeding for 1 year and some pelvic discomfort. History prostate
cancer.

EXAM:
CT ABDOMEN AND PELVIS WITH CONTRAST
TECHNIQUE: Multidetector CT imaging of the abdomen and pelvis was performed
using the standard protocol following bolus administration of
intravenous contrast.
CONTRAST:  85mL BP86QP-F66 IOPAMIDOL ([id number]-F66) INJECTION 61%

[Series 2: axial st · axial · 0.94mm/px · z∈[-1016,-620]mm · 13 of 89 slices shown, 18 images]
[im 5/89  soft-tissue]
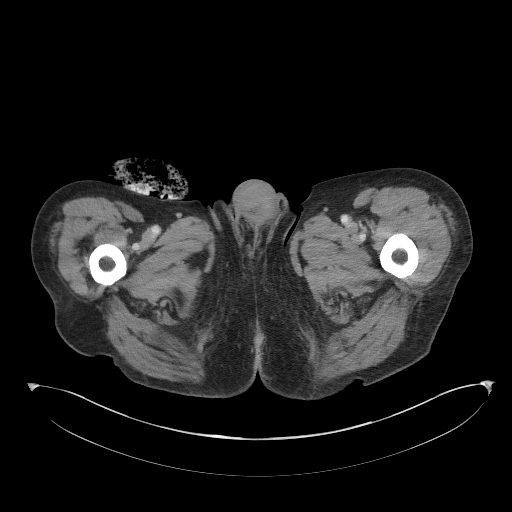
[im 5/89  bone]
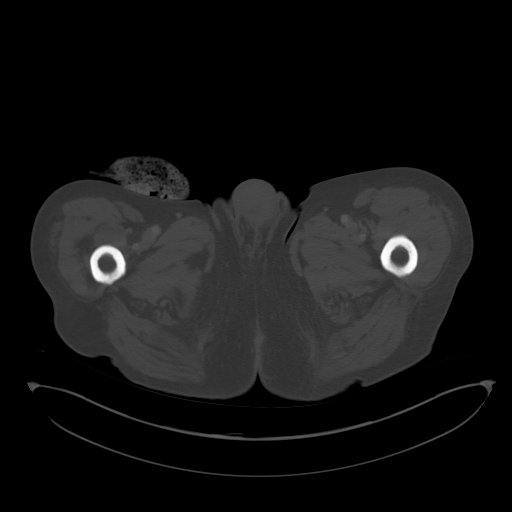
[im 14/89  soft-tissue]
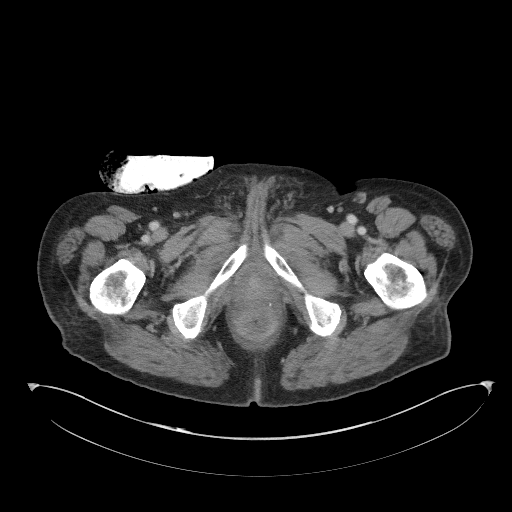
[im 19/89  soft-tissue]
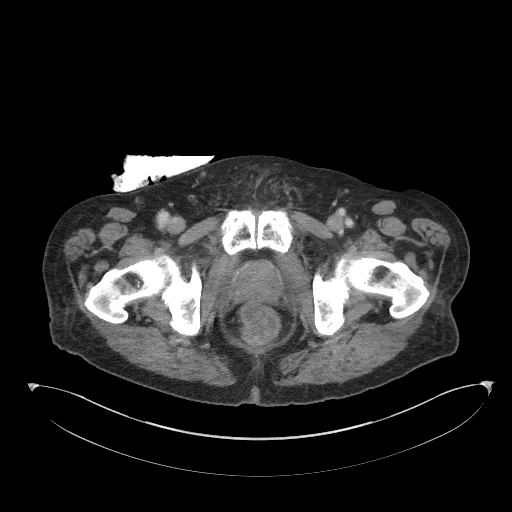
[im 28/89  soft-tissue]
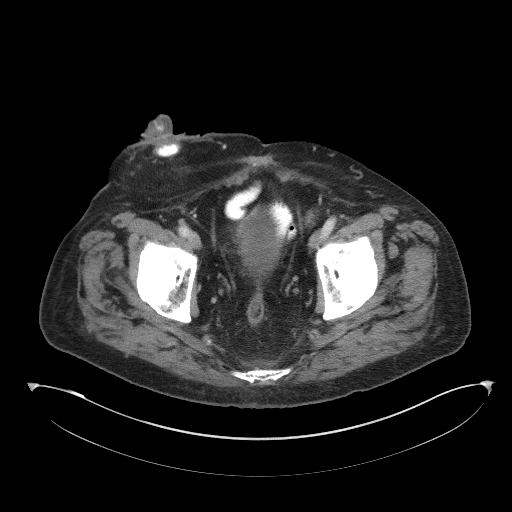
[im 33/89  soft-tissue]
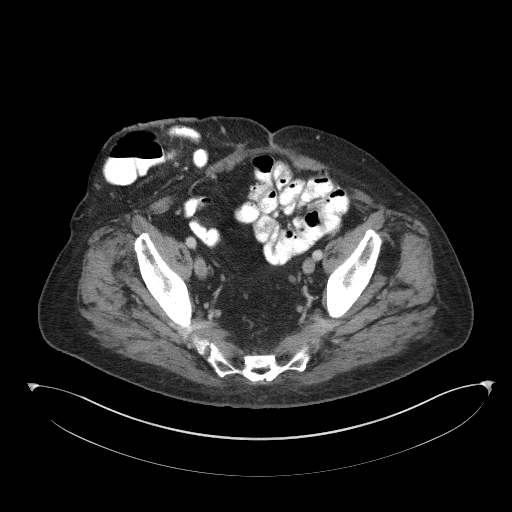
[im 42/89  soft-tissue]
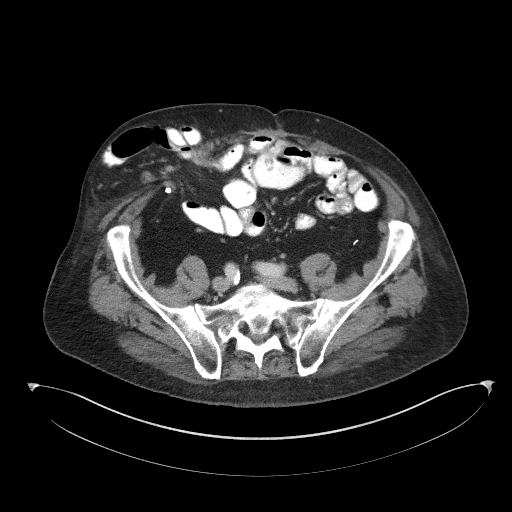
[im 47/89  soft-tissue]
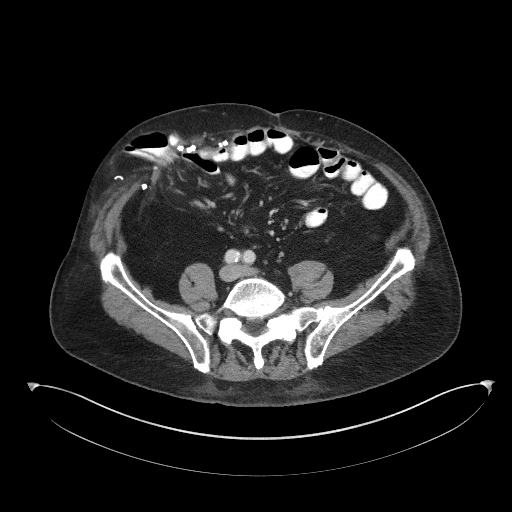
[im 56/89  soft-tissue]
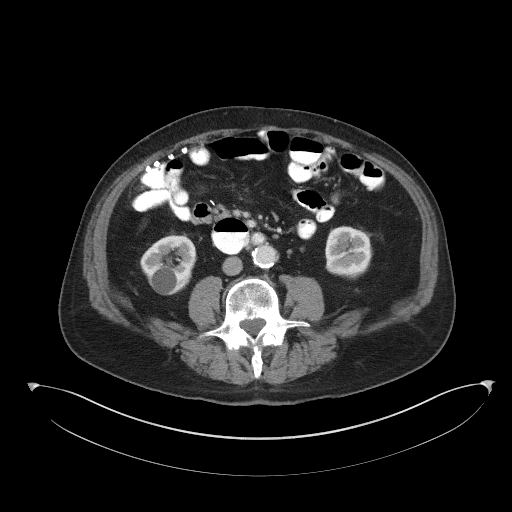
[im 61/89  soft-tissue]
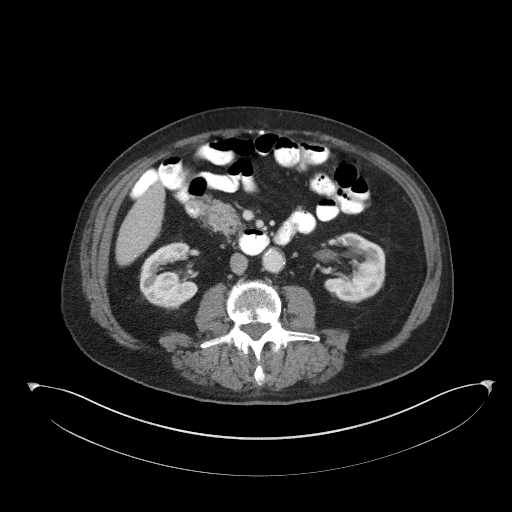
[im 61/89  bone]
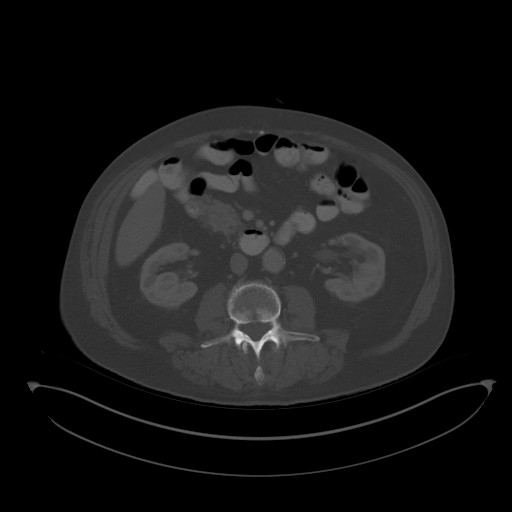
[im 70/89  soft-tissue]
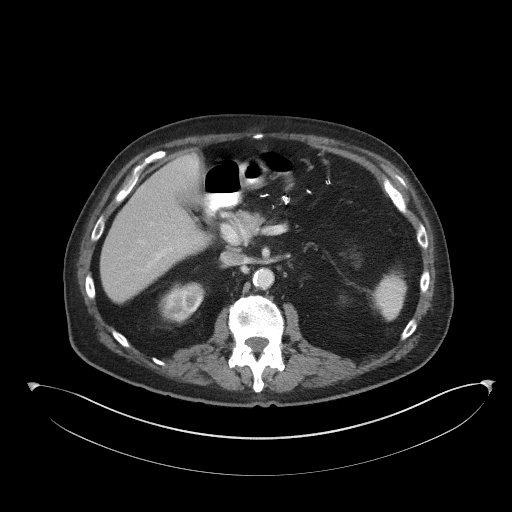
[im 70/89  lung]
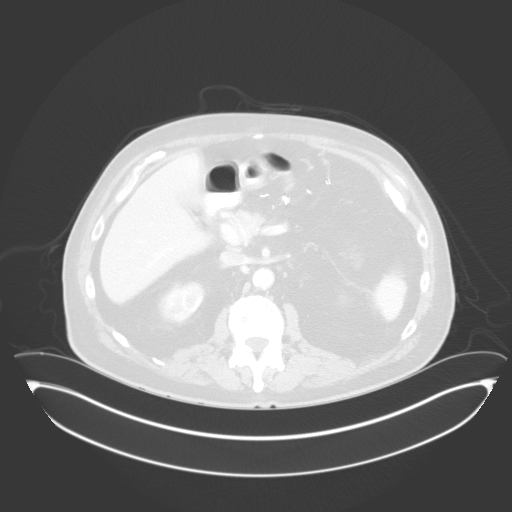
[im 75/89  soft-tissue]
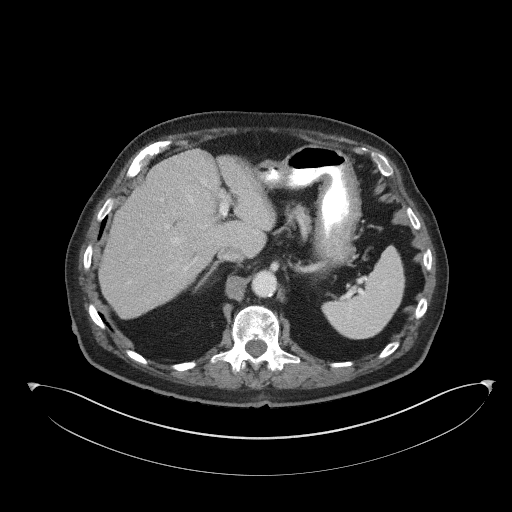
[im 75/89  lung]
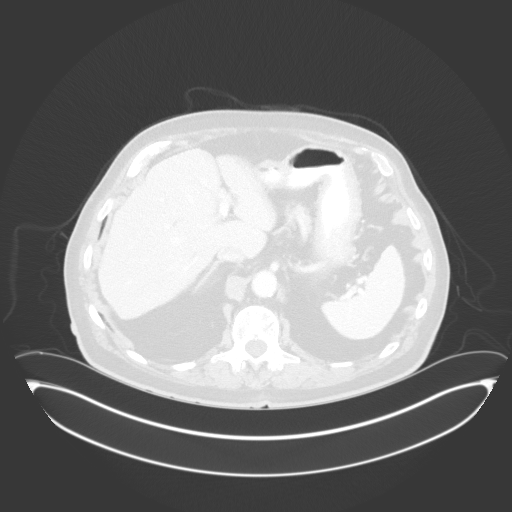
[im 79/89  lung]
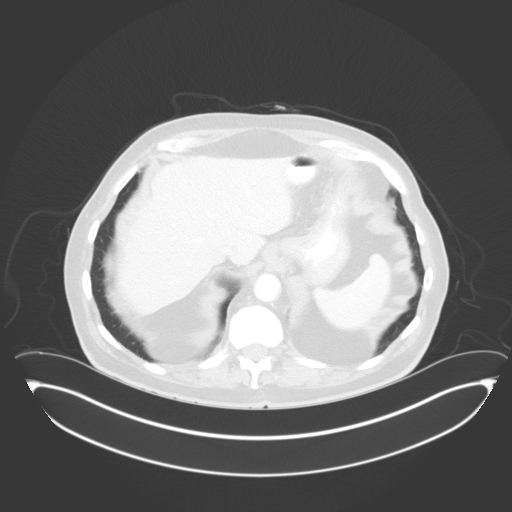
[im 84/89  soft-tissue]
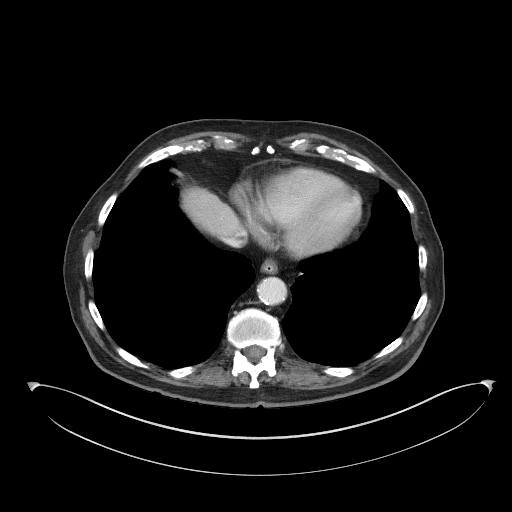
[im 84/89  lung]
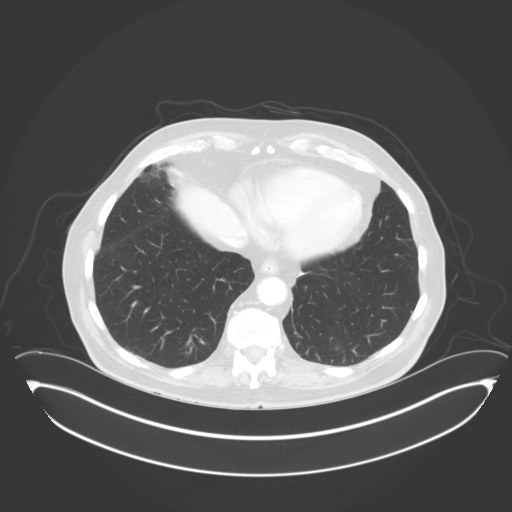

[13 of 32 positions shown; findings below may reference images not displayed]

FINDINGS: Lower chest: Aortic atherosclerosis. Small bilateral Bochdalek's
hernias (left greater than right).

Hepatobiliary: No cystic or solid hepatic lesions. No intra or
extrahepatic biliary ductal dilatation. Gallbladder is normal in
appearance.

Pancreas: No pancreatic mass. No pancreatic ductal dilatation. No
pancreatic or peripancreatic fluid or inflammatory changes.

Spleen: Unremarkable.

Adrenals/Urinary Tract: Low-attenuation lesions in the lower pole of
the right kidney are compatible with simple cysts, largest of which
measures 2.6 cm. Left kidney and bilateral adrenal glands are normal
in appearance. There is no hydroureteronephrosis. Urinary bladder is
normal in appearance.

Stomach/Bowel: Normal appearance of the stomach. 2 large duodenal
diverticulae from the third portion of the duodenum incidentally
noted. No surrounding inflammatory changes. Status post total
colectomy with Hartmann's pouch and right lower quadrant ileostomy,
with large parastomal hernia, similar to the prior examination from
07/01/2015. In the distal Hartmann's pouch immediately above the
anus there is extensive mass-like soft tissue which measures
approximately 3.6 x 3.8 x 6.4 cm (axial image 73 of series 2 and
sagittal image 70 of series 6), concerning for anorectal neoplasm.
Haziness in the adjacent mesorectal fat. Prominent but nonenlarged
right-sided 7 mm short axis mesorectal lymph node (image 68 of
series 2).

Vascular/Lymphatic: Aortic atherosclerosis, without evidence of
aneurysm or dissection in the abdominal or pelvic vasculature. No
lymphadenopathy noted in the abdomen or pelvis.

Reproductive: Prostate gland and seminal vesicles are unremarkable
in appearance.

Other: No significant volume of ascites.  No pneumoperitoneum.

Musculoskeletal: Chronic compression fracture of superior endplate
of L1 and L2, most severe at L2 where there is 30% loss of anterior
vertebral body height. There are no aggressive appearing lytic or
blastic lesions noted in the visualized portions of the skeleton.
IMPRESSION: 1. Mass-like soft tissue in a distal rectum immediately above the
anus within the patient's Hartmann's pouch, highly concerning for
anorectal neoplasm. Correlation with physical examination and
anoscopy is recommended in the near future. Slight haziness in the
adjacent mesorectal fat and borderline enlarged right-sided
mesorectal lymph node.
2. Status post total colectomy with right lower quadrant ileostomy,
with large parastomal hernia.
3. Aortic atherosclerosis.
4. Additional incidental findings, as above.

## 2017-02-23 ENCOUNTER — Inpatient Hospital Stay
Admission: EM | Admit: 2017-02-23 | Discharge: 2017-02-25 | DRG: 683 | Disposition: A | Discharge status: Hospice | Attending: Internal Medicine | Admitting: Internal Medicine

## 2017-02-23 ENCOUNTER — Encounter: Payer: Self-pay | Admitting: Emergency Medicine

## 2017-02-23 ENCOUNTER — Emergency Department

## 2017-02-23 DIAGNOSIS — R627 Adult failure to thrive: Secondary | ICD-10-CM | POA: Diagnosis present

## 2017-02-23 DIAGNOSIS — Z791 Long term (current) use of non-steroidal anti-inflammatories (NSAID): Secondary | ICD-10-CM | POA: Diagnosis not present

## 2017-02-23 DIAGNOSIS — E039 Hypothyroidism, unspecified: Secondary | ICD-10-CM | POA: Diagnosis present

## 2017-02-23 DIAGNOSIS — Z515 Encounter for palliative care: Secondary | ICD-10-CM | POA: Diagnosis present

## 2017-02-23 DIAGNOSIS — I440 Atrioventricular block, first degree: Secondary | ICD-10-CM | POA: Diagnosis present

## 2017-02-23 DIAGNOSIS — Z66 Do not resuscitate: Secondary | ICD-10-CM | POA: Diagnosis not present

## 2017-02-23 DIAGNOSIS — Z87891 Personal history of nicotine dependence: Secondary | ICD-10-CM | POA: Diagnosis not present

## 2017-02-23 DIAGNOSIS — J209 Acute bronchitis, unspecified: Secondary | ICD-10-CM | POA: Diagnosis not present

## 2017-02-23 DIAGNOSIS — Z79899 Other long term (current) drug therapy: Secondary | ICD-10-CM

## 2017-02-23 DIAGNOSIS — R7989 Other specified abnormal findings of blood chemistry: Secondary | ICD-10-CM

## 2017-02-23 DIAGNOSIS — E860 Dehydration: Secondary | ICD-10-CM | POA: Diagnosis not present

## 2017-02-23 DIAGNOSIS — N179 Acute kidney failure, unspecified: Secondary | ICD-10-CM | POA: Diagnosis not present

## 2017-02-23 DIAGNOSIS — G629 Polyneuropathy, unspecified: Secondary | ICD-10-CM | POA: Diagnosis present

## 2017-02-23 DIAGNOSIS — R6889 Other general symptoms and signs: Secondary | ICD-10-CM | POA: Diagnosis not present

## 2017-02-23 DIAGNOSIS — Z8546 Personal history of malignant neoplasm of prostate: Secondary | ICD-10-CM | POA: Diagnosis not present

## 2017-02-23 DIAGNOSIS — R778 Other specified abnormalities of plasma proteins: Secondary | ICD-10-CM

## 2017-02-23 DIAGNOSIS — I10 Essential (primary) hypertension: Secondary | ICD-10-CM | POA: Diagnosis present

## 2017-02-23 DIAGNOSIS — R531 Weakness: Secondary | ICD-10-CM | POA: Diagnosis not present

## 2017-02-23 DIAGNOSIS — R0602 Shortness of breath: Secondary | ICD-10-CM | POA: Diagnosis not present

## 2017-02-23 DIAGNOSIS — C20 Malignant neoplasm of rectum: Secondary | ICD-10-CM | POA: Diagnosis not present

## 2017-02-23 DIAGNOSIS — Z7401 Bed confinement status: Secondary | ICD-10-CM | POA: Diagnosis not present

## 2017-02-23 DIAGNOSIS — E876 Hypokalemia: Secondary | ICD-10-CM | POA: Diagnosis present

## 2017-02-23 DIAGNOSIS — Z882 Allergy status to sulfonamides status: Secondary | ICD-10-CM

## 2017-02-23 DIAGNOSIS — Z8249 Family history of ischemic heart disease and other diseases of the circulatory system: Secondary | ICD-10-CM | POA: Diagnosis not present

## 2017-02-23 DIAGNOSIS — Z79891 Long term (current) use of opiate analgesic: Secondary | ICD-10-CM | POA: Diagnosis not present

## 2017-02-23 DIAGNOSIS — Z933 Colostomy status: Secondary | ICD-10-CM

## 2017-02-23 HISTORY — DX: Malignant neoplasm of rectum: C20

## 2017-02-23 LAB — URINALYSIS, COMPLETE (UACMP) WITH MICROSCOPIC
Bacteria, UA: NONE SEEN
Bilirubin Urine: NEGATIVE
GLUCOSE, UA: NEGATIVE mg/dL
Hgb urine dipstick: NEGATIVE
Ketones, ur: NEGATIVE mg/dL
Leukocytes, UA: NEGATIVE
Nitrite: NEGATIVE
Protein, ur: NEGATIVE mg/dL
SPECIFIC GRAVITY, URINE: 1.018 (ref 1.005–1.030)
SQUAMOUS EPITHELIAL / LPF: NONE SEEN
pH: 6 (ref 5.0–8.0)

## 2017-02-23 LAB — CBC
HCT: 40.5 % (ref 40.0–52.0)
Hemoglobin: 13.2 g/dL (ref 13.0–18.0)
MCH: 27.5 pg (ref 26.0–34.0)
MCHC: 32.5 g/dL (ref 32.0–36.0)
MCV: 84.7 fL (ref 80.0–100.0)
PLATELETS: 186 10*3/uL (ref 150–440)
RBC: 4.78 MIL/uL (ref 4.40–5.90)
RDW: 15.1 % — AB (ref 11.5–14.5)
WBC: 6.2 10*3/uL (ref 3.8–10.6)

## 2017-02-23 LAB — BASIC METABOLIC PANEL
Anion gap: 10 (ref 5–15)
BUN: 34 mg/dL — AB (ref 6–20)
CALCIUM: 8.6 mg/dL — AB (ref 8.9–10.3)
CO2: 30 mmol/L (ref 22–32)
CREATININE: 2.15 mg/dL — AB (ref 0.61–1.24)
Chloride: 99 mmol/L — ABNORMAL LOW (ref 101–111)
GFR calc non Af Amer: 24 mL/min — ABNORMAL LOW (ref >60)
GFR, EST AFRICAN AMERICAN: 28 mL/min — AB (ref >60)
Glucose, Bld: 135 mg/dL — ABNORMAL HIGH (ref 65–99)
Potassium: 3.2 mmol/L — ABNORMAL LOW (ref 3.5–5.1)
Sodium: 139 mmol/L (ref 135–145)

## 2017-02-23 LAB — TROPONIN I
TROPONIN I: 0.04 ng/mL — AB (ref <0.03)
Troponin I: 0.04 ng/mL (ref <0.03)

## 2017-02-23 LAB — CREATININE, URINE, RANDOM: CREATININE, URINE: 97 mg/dL

## 2017-02-23 LAB — SODIUM, URINE, RANDOM: SODIUM UR: 54 mmol/L

## 2017-02-23 LAB — BRAIN NATRIURETIC PEPTIDE: B Natriuretic Peptide: 81 pg/mL (ref 0.0–100.0)

## 2017-02-23 MED ORDER — POTASSIUM CHLORIDE CRYS ER 20 MEQ PO TBCR
40.0000 meq | EXTENDED_RELEASE_TABLET | Freq: Once | ORAL | Status: AC
  Administered 2017-02-23: 40 meq via ORAL
  Filled 2017-02-23: qty 2

## 2017-02-23 MED ORDER — ENOXAPARIN SODIUM 30 MG/0.3ML ~~LOC~~ SOLN
30.0000 mg | SUBCUTANEOUS | Status: DC
  Administered 2017-02-23 – 2017-02-24 (×2): 30 mg via SUBCUTANEOUS
  Filled 2017-02-23 (×2): qty 0.3

## 2017-02-23 MED ORDER — CLONIDINE HCL 0.1 MG PO TABS: 0.1000 mg | ORAL_TABLET | Freq: Two times a day (BID) | ORAL | Status: DC

## 2017-02-23 MED ORDER — CLONIDINE HCL 0.1 MG PO TABS
0.1000 mg | ORAL_TABLET | Freq: Every evening | ORAL | Status: DC
  Administered 2017-02-23 – 2017-02-24 (×2): 0.1 mg via ORAL
  Filled 2017-02-23 (×2): qty 1

## 2017-02-23 MED ORDER — FUROSEMIDE 10 MG/ML IJ SOLN
40.0000 mg | Freq: Once | INTRAMUSCULAR | Status: AC
  Administered 2017-02-23: 40 mg via INTRAVENOUS
  Filled 2017-02-23: qty 4

## 2017-02-23 MED ORDER — HYDROCORTISONE 1 % EX CREA
1.0000 "application " | TOPICAL_CREAM | Freq: Three times a day (TID) | CUTANEOUS | Status: DC | PRN
  Filled 2017-02-23: qty 28

## 2017-02-23 MED ORDER — ACETAMINOPHEN 500 MG PO TABS: 1000.0000 mg | ORAL_TABLET | Freq: Four times a day (QID) | ORAL | Status: DC | PRN

## 2017-02-23 MED ORDER — CLONIDINE HCL 0.1 MG PO TABS
0.2000 mg | ORAL_TABLET | ORAL | Status: DC
  Administered 2017-02-24 – 2017-02-25 (×2): 0.2 mg via ORAL
  Filled 2017-02-23 (×2): qty 2

## 2017-02-23 MED ORDER — IOPAMIDOL (ISOVUE-370) INJECTION 76%
65.0000 mL | Freq: Once | INTRAVENOUS | Status: AC | PRN
  Administered 2017-02-23: 13:00:00 via INTRAVENOUS

## 2017-02-23 MED ORDER — GABAPENTIN 100 MG PO CAPS
100.0000 mg | ORAL_CAPSULE | Freq: Every day | ORAL | Status: DC
  Administered 2017-02-23 – 2017-02-24 (×2): 100 mg via ORAL
  Filled 2017-02-23 (×2): qty 1

## 2017-02-23 MED ORDER — LEVOTHYROXINE SODIUM 88 MCG PO TABS
88.0000 ug | ORAL_TABLET | Freq: Every day | ORAL | Status: DC
  Administered 2017-02-24 – 2017-02-25 (×2): 88 ug via ORAL
  Filled 2017-02-23 (×2): qty 1

## 2017-02-23 MED ORDER — SODIUM CHLORIDE 0.9 % IV BOLUS (SEPSIS)
500.0000 mL | Freq: Once | INTRAVENOUS | Status: AC
  Administered 2017-02-23: 500 mL via INTRAVENOUS

## 2017-02-23 MED ORDER — LEVOFLOXACIN 500 MG PO TABS
250.0000 mg | ORAL_TABLET | Freq: Every day | ORAL | Status: DC
  Administered 2017-02-23 – 2017-02-24 (×2): 250 mg via ORAL
  Filled 2017-02-23 (×2): qty 1

## 2017-02-23 MED ORDER — FAMOTIDINE 20 MG PO TABS
10.0000 mg | ORAL_TABLET | Freq: Every day | ORAL | Status: DC
  Administered 2017-02-23 – 2017-02-24 (×2): 10 mg via ORAL
  Filled 2017-02-23 (×2): qty 1

## 2017-02-23 MED ORDER — TAMSULOSIN HCL 0.4 MG PO CAPS
0.4000 mg | ORAL_CAPSULE | Freq: Every day | ORAL | Status: DC
  Administered 2017-02-23 – 2017-02-25 (×3): 0.4 mg via ORAL
  Filled 2017-02-23 (×3): qty 1

## 2017-02-23 MED ORDER — HYDRALAZINE HCL 20 MG/ML IJ SOLN: 10.0000 mg | Freq: Four times a day (QID) | INTRAMUSCULAR | Status: DC | PRN

## 2017-02-23 MED ORDER — MORPHINE SULFATE (PF) 2 MG/ML IV SOLN
2.0000 mg | Freq: Once | INTRAVENOUS | Status: AC
  Administered 2017-02-23: 2 mg via INTRAVENOUS
  Filled 2017-02-23: qty 1

## 2017-02-23 MED ORDER — SODIUM CHLORIDE 0.9 % IV SOLN
INTRAVENOUS | Status: DC
  Administered 2017-02-23 – 2017-02-24 (×3): via INTRAVENOUS

## 2017-02-23 MED ORDER — OXYCODONE HCL 5 MG PO TABS: 5.0000 mg | ORAL_TABLET | ORAL | Status: DC | PRN

## 2017-02-23 NOTE — Progress Notes (Signed)
PHARMACY NOTE -  ANTIBIOTIC RENAL DOSE ADJUSTMENT   Patient has been initiated on LEVOFLOXACIN 500mg  for Acute bronchitis.  SCr 2.1, estimated CrCl 20.3 ml/min  Dose was adjusted to LEVOFLOXACIN 250mg  daily based on current renal function.   Pernell Dupre, PharmD, BCPS Clinical Pharmacist 02/23/2017 3:16 PM

## 2017-02-23 NOTE — ED Notes (Signed)
Dr. Patel at bedside 

## 2017-02-23 NOTE — ED Notes (Signed)
Date and time results received: 02/23/17 1302 (use smartphrase ".now" to insert current time)  Test: troponin Critical Value: 0.04  Name of Provider Notified: Dr. Mable Paris  Orders Received: no new orders, will continue to monitor

## 2017-02-23 NOTE — ED Notes (Signed)
Pt returned from xray

## 2017-02-23 NOTE — ED Provider Notes (Signed)
[facility] Emergency Department Provider Note  ____________________________________________   First MD Initiated Contact with Patient 02/23/17 1138     (approximate)  I have reviewed the triage vital signs and the nursing notes.   HISTORY  Chief Complaint Cough and Weakness    HPI Gregory Valdez is a 83 y.o. male who comes to the emergency department via EMS for roughly 1 week of progressive fatigue shortness of breath. He has a history of colon cancer and is status post surgical resection in the 1970s and has never received chemotherapy or radiation. He is DNR/DNI. She is currently on home hospice. On Tuesday of this last week his hospice nurse called in a prescription for azithromycin for possible pneumonia and the patient has completed almost entire course but has noted shortness of breath is worsened which prompted the visit today.   Past Medical History:  Diagnosis Date  . Cancer [facility])    Prostate cancer with XRT  . Cataract   . Hypertension   . Neuropathy   . Rectal cancer ([place])   . Thyroid disease     Patient Active Problem List   Diagnosis Date Noted  . Acute renal failure (ARF) ([place]) 02/23/2017  . Rectal mass 08/13/2016    Past Surgical History:  Procedure Laterality Date  . COLON RESECTION    . COLONOSCOPY  1995  . eliostomy  1995  . EYE SURGERY    . HERNIA REPAIR    . PROSTATE SURGERY      Prior to Admission medications   Medication Sig Start Date End Date Taking? Authorizing Provider  acetaminophen (TYLENOL) 500 MG tablet Take 1,000 mg by mouth every 6 (six) hours as needed.   Yes Historical Provider, MD  azithromycin (ZITHROMAX) 250 MG tablet Take by mouth daily. Today is last dose of azithromycin   Yes Historical Provider, MD  cloNIDine (CATAPRES) 0.1 MG tablet Take 0.1 mg by mouth 2 (two) times daily. Take 2 in the morning and 1 in the evening.   Yes Historical Provider, MD  gabapentin (NEURONTIN) 100 MG capsule Take 100  mg by mouth at bedtime.   Yes Historical Provider, MD  ibuprofen (ADVIL,MOTRIN) 200 MG tablet Take 200 mg by mouth every 6 (six) hours as needed.   Yes Historical Provider, MD  levothyroxine (SYNTHROID, LEVOTHROID) 88 MCG tablet Take 88 mcg by mouth daily before breakfast.   Yes Historical Provider, MD  loratadine (CLARITIN) 10 MG tablet Take 10 mg by mouth daily.   Yes Historical Provider, MD  losartan-hydrochlorothiazide (HYZAAR) 50-12.5 MG tablet Take 1 tablet by mouth daily.   Yes Historical Provider, MD  Multiple Vitamin (MULTI VITAMIN DAILY PO) Take 1 tablet by mouth daily.    Yes Historical Provider, MD  oxycodone (OXY-IR) 5 MG capsule Take 5 mg by mouth every 4 (four) hours as needed.   Yes Historical Provider, MD  ranitidine (ZANTAC) 150 MG tablet Take 150 mg by mouth as needed for heartburn.   Yes Historical Provider, MD  tamsulosin (FLOMAX) 0.4 MG CAPS capsule Take 0.4 mg by mouth.   Yes Historical Provider, MD    Allergies Sulfa antibiotics  No family history on file.  Social History Social History  Substance Use Topics  . Smoking status: Former Smoker    Years: 30.00  . Smokeless tobacco: Never Used  . Alcohol use No    Review of Systems Constitutional: No fever/chills Eyes: No visual changes. ENT: No sore throat. Cardiovascular: Denies chest pain. Respiratory:  Positive shortness of breath. Gastrointestinal: No abdominal pain.  No nausea, no vomiting.  No diarrhea.  No constipation. Genitourinary: Negative for dysuria. Musculoskeletal: Negative for back pain. Skin: Negative for rash. Neurological: Negative for headaches, focal weakness or numbness.  10-point ROS otherwise negative.  ____________________________________________   PHYSICAL EXAM:  VITAL SIGNS: ED Triage Vitals  Enc Vitals Group     BP 02/23/17 1115 (!) 185/75     Pulse Rate 02/23/17 1115 67     Resp 02/23/17 1108 16     Temp 02/23/17 1108 97.8 F (36.6 C)     Temp Source 02/23/17 1108  Oral     SpO2 02/23/17 1106 96 %     Weight 02/23/17 1108 179 lb 10.8 oz (81.5 kg)     Height --      Head Circumference --      Peak Flow --      Pain Score --      Pain Loc --      Pain Edu? --      Excl. in [place]? --     Constitutional: Alert and oriented x 4 well appearing nontoxic no diaphoresis speaks in full, clear sentences Eyes: PERRL EOMI. Head: Atraumatic. Nose: No congestion/rhinnorhea. Mouth/Throat: No trismus Neck: No stridor.   Cardiovascular: Normal rate, regular rhythm. Grossly normal heart sounds.  Good peripheral circulation. Respiratory: Normal respiratory effort.  No retractions. Lungs CTAB and moving good air Gastrointestinal: Soft nondistended nontender no rebound no guarding no peritonitis no McBurney's tenderness negative Rovsing's no costovertebral tenderness negative Murphy's Musculoskeletal: No lower extremity edema   Neurologic:  Normal speech and language. No gross focal neurologic deficits are appreciated. Skin:  Skin is warm, dry and intact. No rash noted. Psychiatric: Mood and affect are normal. Speech and behavior are normal.    ____________________________________________   DIFFERENTIAL  Pneumonia, bronchitis, pulmonary embolism, dehydration ____________________________________________   LABS (all labs ordered are listed, but only abnormal results are displayed)  Labs Reviewed  BASIC METABOLIC PANEL - Abnormal; Notable for the following:       Result Value   Potassium 3.2 (*)    Chloride 99 (*)    Glucose, Bld 135 (*)    BUN 34 (*)    Creatinine, Ser 2.15 (*)    Calcium 8.6 (*)    GFR calc non Af Amer 24 (*)    GFR calc Af Amer 28 (*)    All other components within normal limits  CBC - Abnormal; Notable for the following:    RDW 15.1 (*)    All other components within normal limits  URINALYSIS, COMPLETE (UACMP) WITH MICROSCOPIC - Abnormal; Notable for the following:    Color, Urine YELLOW (*)    APPearance CLEAR (*)    All other  components within normal limits  TROPONIN I - Abnormal; Notable for the following:    Troponin I 0.04 (*)    All other components within normal limits  BRAIN NATRIURETIC PEPTIDE  SODIUM, URINE, RANDOM  CREATININE, URINE, RANDOM  TROPONIN I  TROPONIN I  BASIC METABOLIC PANEL  CBC    Evidence of dehydration and some acute kidney injury slightly elevated troponin __________________________________________  EKG  ED ECG REPORT I, Darel Hong, the attending physician, personally viewed and interpreted this ECG.  Date: 02/23/2017 EKG Time: 1114 Rate: 74 Rhythm: normal sinus rhythm QRS Axis: normal Intervals: First-degree AV block ST/T Wave abnormalities: normal Conduction Disturbances: none Narrative Interpretation: Abnormal  ____________________________________________  RADIOLOGY  CT scan is  fortunately negative for acute pathology ____________________________________________   PROCEDURES  Procedure(s) performed: no  Procedures  Critical Care performed: yes  CRITICAL CARE Performed by: Darel Hong   Total critical care time: 32 minutes  Critical care time was exclusive of separately billable procedures and treating other patients.  Critical care was necessary to treat or prevent imminent or life-threatening deterioration.  Critical care was time spent personally by me on the following activities: development of treatment plan with patient and/or surrogate as well as nursing, discussions with consultants, evaluation of patient's response to treatment, examination of patient, obtaining history from patient or surrogate, ordering and performing treatments and interventions, ordering and review of laboratory studies, ordering and review of radiographic studies, pulse oximetry and re-evaluation of patient's condition.   ____________________________________________   INITIAL IMPRESSION / ASSESSMENT AND PLAN / ED COURSE  Pertinent labs & imaging results that  were available during my care of the patient were reviewed by me and considered in my medical decision making (see chart for details).  The patient appears somewhat short of breath and while he is not oxygen dependent at home she is saturating 88% on room air here but perks up with nasal cannula. Fortunately his chest x-ray is normal and his EKG is unrevealing. Differential is broad and includes pneumonia that is not visible to the x-ray, pulmonary embolism, progression of his metastatic disease, congestive heart failure. I discussed with the family and the patient that if it were a pulmonary embolism to treatment might be worse than the disease itself, however we all agreed that it is worth getting a diagnosis so we will proceed with a CT scan. I will give him 2 mg of IV morphine now for the subjective shortness of breath as well as 40 mg of nebulized furosemide for shortness of breath and not for diuresis.    Fortunately the patient's CT scan is negative for acute pathology. He is hypoxic to 88% on room air still. I offered the patient and his family outpatient management with oral rehydration and close follow-up versus inpatient admission and the patient does not feel comfortable going home. At this point he requires inpatient admission.  ____________________________________________   FINAL CLINICAL IMPRESSION(S) / ED DIAGNOSES  Final diagnoses:  Acute kidney injury ([place])  Dehydration      NEW MEDICATIONS STARTED DURING THIS VISIT:  Current Discharge Medication List       Note:  This document was prepared using Dragon voice recognition software and may include unintentional dictation errors.     Darel Hong, MD 02/23/17 1730

## 2017-02-23 NOTE — ED Notes (Signed)
RN in room drawing blood. Pt O2 sat dropped to 88% on room air with good wave form. Per pt family, pt does not wear O2 at home. Pt started on 2 liter O2 via [place].

## 2017-02-23 NOTE — H&P (Signed)
[facility] [place] [facility] [place] NAME: Gregory Valdez    MR#:  [id number]  DATE OF BIRTH:  1915-04-27  DATE OF ADMISSION:  02/23/2017  PRIMARY CARE PHYSICIAN: Cletis Athens, MD   REQUESTING/REFERRING PHYSICIAN: Darel Hong MD  CHIEF COMPLAINT:   Chief Complaint  Patient presents with  . Cough  . Weakness    HISTORY OF PRESENT ILLNESS: Gregory Valdez  is a 83 y.o. male with a known history of Rectal cancer, hypertension, hypothyroidism, previous history of prostate cancer as well as history of colostomy due to on colonic bleed, who presents to the emergency room with complaint of having cough and weakness. Patient is currently followed by hospice and he's been having these symptoms ongoing for the past few days progressively worse. Patient came to the ER with these complaints CT scan of the chest showed no pneumonia. However his renal function has worsened. And he is being admitted for dehydration. Patient does complain of productive cough of whitish sputum. No fevers or chills no chest pain palpitations no abdominal pain. Has chronic rectal bleed related to rectal cancer PAST MEDICAL HISTORY:   Past Medical History:  Diagnosis Date  . Cancer [facility])    Prostate cancer with XRT  . Cataract   . Hypertension   . Neuropathy   . Rectal cancer ([place])   . Thyroid disease     PAST SURGICAL HISTORY:  Past Surgical History:  Procedure Laterality Date  . COLON RESECTION    . COLONOSCOPY  1995  . eliostomy  1995  . EYE SURGERY    . HERNIA REPAIR    . PROSTATE SURGERY      SOCIAL HISTORY:  Social History  Substance Use Topics  . Smoking status: Former Smoker    Years: 30.00  . Smokeless tobacco: Never Used  . Alcohol use No    FAMILY HISTORY: Mother with hypertension  DRUG ALLERGIES:  Allergies  Allergen Reactions  . Sulfa Antibiotics     REVIEW OF SYSTEMS:   CONSTITUTIONAL: No fever,Positive fatigue and weakness.  EYES: No blurred or double  vision.  EARS, NOSE, AND THROAT: No tinnitus or ear pain.  RESPIRATORY: No cough, shortness of breath, wheezing or hemoptysis.  CARDIOVASCULAR: No chest pain, orthopnea, edema.  GASTROINTESTINAL: No nausea, vomiting, diarrhea or abdominal pain. Chronic rectal bleed GENITOURINARY: No dysuria, hematuria.  ENDOCRINE: No polyuria, nocturia,  HEMATOLOGY: No anemia, easy bruising or bleeding SKIN: No rash or lesion. MUSCULOSKELETAL: No joint pain or arthritis.   NEUROLOGIC: No tingling, numbness, weakness.  PSYCHIATRY: No anxiety or depression.   MEDICATIONS AT HOME:  Prior to Admission medications   Medication Sig Start Date End Date Taking? Authorizing Provider  acetaminophen (TYLENOL) 500 MG tablet Take 1,000 mg by mouth every 6 (six) hours as needed.   Yes Historical Provider, MD  azithromycin (ZITHROMAX) 250 MG tablet Take by mouth daily. Today is last dose of azithromycin   Yes Historical Provider, MD  cloNIDine (CATAPRES) 0.1 MG tablet Take 0.1 mg by mouth 2 (two) times daily. Take 2 in the morning and 1 in the evening.   Yes Historical Provider, MD  gabapentin (NEURONTIN) 100 MG capsule Take 100 mg by mouth at bedtime.   Yes Historical Provider, MD  ibuprofen (ADVIL,MOTRIN) 200 MG tablet Take 200 mg by mouth every 6 (six) hours as needed.   Yes Historical Provider, MD  levothyroxine (SYNTHROID, LEVOTHROID) 88 MCG tablet Take 88 mcg by mouth daily before breakfast.   Yes Historical  Provider, MD  loratadine (CLARITIN) 10 MG tablet Take 10 mg by mouth daily.   Yes Historical Provider, MD  losartan-hydrochlorothiazide (HYZAAR) 50-12.5 MG tablet Take 1 tablet by mouth daily.   Yes Historical Provider, MD  Multiple Vitamin (MULTI VITAMIN DAILY PO) Take 1 tablet by mouth daily.    Yes Historical Provider, MD  oxycodone (OXY-IR) 5 MG capsule Take 5 mg by mouth every 4 (four) hours as needed.   Yes Historical Provider, MD  ranitidine (ZANTAC) 150 MG tablet Take 150 mg by mouth as needed for  heartburn.   Yes Historical Provider, MD  tamsulosin (FLOMAX) 0.4 MG CAPS capsule Take 0.4 mg by mouth.   Yes Historical Provider, MD      PHYSICAL EXAMINATION:   VITAL SIGNS: Blood pressure (!) 125/54, pulse 66, temperature 97.8 F (36.6 C), temperature source Oral, resp. rate (!) 21, weight 179 lb 10.8 oz (81.5 kg), SpO2 99 %.  GENERAL:  83 y.o.-year-old patient lying in the bed with no acute distress.  EYES: Pupils equal, round, reactive to light and accommodation. No scleral icterus. Extraocular muscles intact.  HEENT: Head atraumatic, normocephalic. Oropharynx and nasopharynx clear.  NECK:  Supple, no jugular venous distention. No thyroid enlargement, no tenderness.  LUNGS: Normal breath sounds bilaterally, no wheezing, rales,rhonchi or crepitation. No use of accessory muscles of respiration.  CARDIOVASCULAR: S1, S2 normal. No murmurs, rubs, or gallops.  ABDOMEN: Soft, nontender, nondistended. Bowel sounds present. No organomegaly or mass. Colostomy bag in place EXTREMITIES: No pedal edema, cyanosis, or clubbing.  NEUROLOGIC: Cranial nerves II through XII are intact. Muscle strength 5/5 in all extremities. Sensation intact. Gait not checked.  PSYCHIATRIC: The patient is alert and oriented x 3.  SKIN: No obvious rash, lesion, or ulcer.   LABORATORY PANEL:   CBC  Recent Labs Lab 02/23/17 1126  WBC 6.2  HGB 13.2  HCT 40.5  PLT 186  MCV 84.7  MCH 27.5  MCHC 32.5  RDW 15.1*   ------------------------------------------------------------------------------------------------------------------  Chemistries   Recent Labs Lab 02/23/17 1126  NA 139  K 3.2*  CL 99*  CO2 30  GLUCOSE 135*  BUN 34*  CREATININE 2.15*  CALCIUM 8.6*   ------------------------------------------------------------------------------------------------------------------ estimated creatinine clearance is 20.3 mL/min (A) (by C-G formula based on SCr of 2.15 mg/dL  (H)). ------------------------------------------------------------------------------------------------------------------ No results for input(s): TSH, T4TOTAL, T3FREE, THYROIDAB in the last 72 hours.  Invalid input(s): FREET3   Coagulation profile No results for input(s): INR, PROTIME in the last 168 hours. ------------------------------------------------------------------------------------------------------------------- No results for input(s): DDIMER in the last 72 hours. -------------------------------------------------------------------------------------------------------------------  Cardiac Enzymes  Recent Labs Lab 02/23/17 1126  TROPONINI 0.04*   ------------------------------------------------------------------------------------------------------------------ Invalid input(s): POCBNP  ---------------------------------------------------------------------------------------------------------------  Urinalysis    Component Value Date/Time   COLORURINE Yellow 11/16/2014 0826   APPEARANCEUR Clear 11/16/2014 0826   LABSPEC 1.020 11/16/2014 0826   PHURINE 6.0 11/16/2014 0826   GLUCOSEU Negative 11/16/2014 0826   HGBUR Negative 11/16/2014 0826   BILIRUBINUR Negative 11/16/2014 0826   KETONESUR Trace 11/16/2014 0826   PROTEINUR 30 mg/dL 11/16/2014 0826   NITRITE Negative 11/16/2014 0826   LEUKOCYTESUR Negative 11/16/2014 0826     RADIOLOGY: Dg Chest 2 View  Result Date: 02/23/2017 CLINICAL DATA:  New onset weakness today. EXAM: CHEST  2 VIEW COMPARISON:  Single-view of the chest 03/13/2005. FINDINGS: The lungs are clear. Heart size is normal. No pneumothorax or pleural effusion. Aortic atherosclerosis noted. No acute bony abnormality. IMPRESSION: No acute disease. Atherosclerosis. Electronically Signed   By: Marcello Moores  Dalessio M.D.   On: 02/23/2017 11:49   Ct Angio Chest Pe W/cm &/or Wo Cm  Result Date: 02/23/2017 CLINICAL DATA:  Pt with increasing SOB over the last few  weeks. Per family pt was started on Leon 5-days ago. Pt states increase in energy level as well. Patient has decreased renal function. Emergency room physician is aware of this and still desired the exam be performed. Study performed with a reduced dose of of intravenous contrast. EXAM: CT ANGIOGRAPHY CHEST WITH CONTRAST TECHNIQUE: Multidetector CT imaging of the chest was performed using the standard protocol during bolus administration of intravenous contrast. Multiplanar CT image reconstructions and MIPs were obtained to evaluate the vascular anatomy. CONTRAST:  60 mL of Isovue 370 intravenous contrast COMPARISON:  Current chest radiograph. FINDINGS: Cardiovascular: Satisfactory opacification of the pulmonary arteries to the segmental level. No evidence of pulmonary embolism. Normal heart size. No pericardial effusion. There are minor coronary artery calcifications. The thoracic aorta is normal in caliber. There is atherosclerosis along the aortic arch and descending thoracic aorta and at the origin of the branch vessels. No significant stenosis. Mediastinum/Nodes: No enlarged mediastinal, hilar, or axillary lymph nodes. Thyroid gland, trachea, and esophagus demonstrate no significant findings. Lungs/Pleura: No acute findings. No evidence of pneumonia or pulmonary edema. There several small pulmonary nodules, largest 5 mm peripheral right lower lobe nodule, image 76, series 6. No pleural effusion. No pneumothorax. Upper Abdomen: No acute abnormality. Musculoskeletal: No acute fracture. No osteoblastic or osteolytic lesions. Review of the MIP images confirms the above findings. IMPRESSION: 1. No evidence of a pulmonary embolism. 2. No acute findings. 3. Small pulmonary nodules, almost certainly benign. No specific follow-up recommended in this age group. Electronically Signed   By: Lajean Manes M.D.   On: 02/23/2017 13:17    EKG: Orders placed or performed during the hospital encounter of 02/23/17  . ED EKG   . ED EKG  . ED EKG  . ED EKG  . EKG 12-Lead  . EKG 12-Lead  . EKG 12-Lead  . EKG 12-Lead    IMPRESSION AND PLAN: Patient is a 83 year old white male who is currently followed by home hospice presents to the emergency room complaining of generalized weakness cough congestion   1. Acute renal failure this is due to dehydration I will stop his losartan HCTZ Give him IV fluids monitor renal function  2. Hypokalemia we'll replace potassium  3. Acute bronchitis I will treat him with oral Levaquin  4. Essential hypertension continue clonidine and I will discontinue his losartan HCTZ  5. Rectal cancer followed by hospice prognosis is poor if no significant improvement in the next day or 2 last palliative care team to see  6. misc scd's   All the records are reviewed and case discussed with ED provider. Management plans discussed with the patient, family and they are in agreement.  CODE STATUS: Code Status History    This patient does not have a recorded code status. Please follow your organizational policy for patients in this situation.    Advance Directive Documentation     Most Recent Value  Type of Advance Directive  Out of facility DNR (pink MOST or yellow form)  Pre-existing out of facility DNR order (yellow form or pink MOST form)  Yellow form placed in chart (order not valid for inpatient use)  "MOST" Form in Place?  -       TOTAL TIME TAKING CARE OF THIS PATIENT:55 minutes.    Dustin Flock M.D on  02/23/2017 at 2:48 PM  Between 7am to 6pm - Pager - [phone]  After 6pm go to [email or web address] - password [email or web address] [facility]  Office  [phone]  CC: Primary care physician; Cletis Athens, MD

## 2017-02-23 NOTE — ED Triage Notes (Signed)
Pt brought in by [facility] from home for cough x 1 week and weakness "for a while". Pt denies any pain but states that he has been more short of breath than normal. Pt denies chills. Pt is hospice patient. Pt currently in NAD.

## 2017-02-23 NOTE — ED Notes (Signed)
Pt taken for x-ray by Dawn, rad tech

## 2017-02-24 LAB — CBC
HEMATOCRIT: 36.9 % — AB (ref 40.0–52.0)
Hemoglobin: 12.2 g/dL — ABNORMAL LOW (ref 13.0–18.0)
MCH: 28.3 pg (ref 26.0–34.0)
MCHC: 33 g/dL (ref 32.0–36.0)
MCV: 85.6 fL (ref 80.0–100.0)
Platelets: 153 10*3/uL (ref 150–440)
RBC: 4.31 MIL/uL — ABNORMAL LOW (ref 4.40–5.90)
RDW: 15 % — ABNORMAL HIGH (ref 11.5–14.5)
WBC: 6.4 10*3/uL (ref 3.8–10.6)

## 2017-02-24 LAB — BASIC METABOLIC PANEL
Anion gap: 7 (ref 5–15)
BUN: 31 mg/dL — AB (ref 6–20)
CO2: 27 mmol/L (ref 22–32)
Calcium: 8 mg/dL — ABNORMAL LOW (ref 8.9–10.3)
Chloride: 106 mmol/L (ref 101–111)
Creatinine, Ser: 1.59 mg/dL — ABNORMAL HIGH (ref 0.61–1.24)
GFR calc Af Amer: 40 mL/min — ABNORMAL LOW (ref >60)
GFR, EST NON AFRICAN AMERICAN: 34 mL/min — AB (ref >60)
GLUCOSE: 113 mg/dL — AB (ref 65–99)
POTASSIUM: 3.3 mmol/L — AB (ref 3.5–5.1)
Sodium: 140 mmol/L (ref 135–145)

## 2017-02-24 LAB — TROPONIN I: TROPONIN I: 0.04 ng/mL — AB (ref <0.03)

## 2017-02-24 MED ORDER — ENSURE ENLIVE PO LIQD
237.0000 mL | Freq: Three times a day (TID) | ORAL | Status: DC
  Administered 2017-02-24 (×2): 237 mL via ORAL

## 2017-02-24 MED ORDER — PHENAZOPYRIDINE HCL 100 MG PO TABS
100.0000 mg | ORAL_TABLET | Freq: Three times a day (TID) | ORAL | Status: DC
  Administered 2017-02-24 – 2017-02-25 (×2): 100 mg via ORAL
  Filled 2017-02-24 (×3): qty 1

## 2017-02-24 NOTE — Progress Notes (Signed)
Follow up on current hospice patient followed for rectal cancer who was admitted to [facility] on 4.22.18 @ 1538 with diagnosis of acute renal failure r/t dehydration and acute bronchitis.  Gregory Valdez is currently on IV fluids and IV levofloxacin 54m.  I met with [name] one of his daughters.  [name] he wants to go to the hospice home and die but his daughters want to take him home.  Discussed with Mr. ACaysonand his daughter criteria for the hospice home.  They were appreciative of the information.  It does not appear at this time that Gregory Valdez in hospice home appropriate.  He states he does not want to be a burden on his family and he knows he is as he can no longer take care of himself.  The daughter states her and her sister take care of him at home and want to continue to do so, but may need to hire some help with [name].  No plans for discharge today.  Patient states he feels terrible, but can not elaborate further.  Pain meds are ordered, but have not been given.  Educated daughter of letting nurse know when her father is in pain and if he needs something- to advocate for him.  He is reluctant to use his call bell as he doesn't want to be a bother.  All verbalized understanding.  Will continue to follow.  Updated hospital information faxed to triage by KFlo Shanks RN.

## 2017-02-24 NOTE — Progress Notes (Signed)
Patient has a scant amount of rectal bleeding. History of rectal cancer noted in H&P. Fritzi Mandes, MD notified. Per MD, continue to monitor.

## 2017-02-24 NOTE — Progress Notes (Addendum)
[place] at [place] NAME: Gregory Valdez    MR#:  [id number]  DATE OF BIRTH:  1915/03/01  SUBJECTIVE:  Came in after feeling weak and poor po intake  REVIEW OF SYSTEMS:   Review of Systems  Constitutional: Negative for chills, fever and weight loss.  HENT: Negative for ear discharge, ear pain and nosebleeds.   Eyes: Negative for blurred vision, pain and discharge.  Respiratory: Negative for sputum production, shortness of breath, wheezing and stridor.   Cardiovascular: Negative for chest pain, palpitations, orthopnea and PND.  Gastrointestinal: Negative for abdominal pain, diarrhea, nausea and vomiting.  Genitourinary: Positive for dysuria. Negative for frequency and urgency.  Musculoskeletal: Negative for back pain and joint pain.  Neurological: Positive for weakness. Negative for sensory change, speech change and focal weakness.  Psychiatric/Behavioral: Negative for depression and hallucinations. The patient is not nervous/anxious.    Tolerating Diet:yes Tolerating PT: pending  DRUG ALLERGIES:   Allergies  Allergen Reactions  . Sulfa Antibiotics     VITALS:  Blood pressure (!) 157/64, pulse 80, temperature 97.9 F (36.6 C), temperature source Oral, resp. rate 20, height 5\' 11"  (1.803 m), weight 78.5 kg (173 lb 1.6 oz), SpO2 95 %.  PHYSICAL EXAMINATION:   Physical Exam  GENERAL:  83 y.o.-year-old patient lying in the bed with no acute distress. weak EYES: Pupils equal, round, reactive to light and accommodation. No scleral icterus. Extraocular muscles intact.  HEENT: Head atraumatic, normocephalic. Oropharynx and nasopharynx clear. Dry oral mucosa NECK:  Supple, no jugular venous distention. No thyroid enlargement, no tenderness.  LUNGS: Normal breath sounds bilaterally, no wheezing, rales, rhonchi. No use of accessory muscles of respiration.  CARDIOVASCULAR: S1, S2 normal. No murmurs, rubs, or gallops.  ABDOMEN: Soft,  nontender, nondistended. Bowel sounds present. No organomegaly or mass.  EXTREMITIES: No cyanosis, clubbing or edema b/l.    NEUROLOGIC: Cranial nerves II through XII are intact. No focal Motor or sensory deficits b/l.   PSYCHIATRIC:  patient is alert and oriented x 3.  SKIN: No obvious rash, lesion, or ulcer.   LABORATORY PANEL:  CBC  Recent Labs Lab 02/24/17 0158  WBC 6.4  HGB 12.2*  HCT 36.9*  PLT 153    Chemistries   Recent Labs Lab 02/24/17 0158  NA 140  K 3.3*  CL 106  CO2 27  GLUCOSE 113*  BUN 31*  CREATININE 1.59*  CALCIUM 8.0*   Cardiac Enzymes  Recent Labs Lab 02/24/17 0158  TROPONINI 0.04*   RADIOLOGY:  Dg Chest 2 View  Result Date: 02/23/2017 CLINICAL DATA:  New onset weakness today. EXAM: CHEST  2 VIEW COMPARISON:  Single-view of the chest 03/13/2005. FINDINGS: The lungs are clear. Heart size is normal. No pneumothorax or pleural effusion. Aortic atherosclerosis noted. No acute bony abnormality. IMPRESSION: No acute disease. Atherosclerosis. Electronically Signed   By: Inge Rise M.D.   On: 02/23/2017 11:49   Ct Angio Chest Pe W/cm &/or Wo Cm  Result Date: 02/23/2017 CLINICAL DATA:  Pt with increasing SOB over the last few weeks. Per family pt was started on [place] 5-days ago. Pt states increase in energy level as well. Patient has decreased renal function. Emergency room physician is aware of this and still desired the exam be performed. Study performed with a reduced dose of of intravenous contrast. EXAM: CT ANGIOGRAPHY CHEST WITH CONTRAST TECHNIQUE: Multidetector CT imaging of the chest was performed using the standard protocol during bolus administration of intravenous contrast.  Multiplanar CT image reconstructions and MIPs were obtained to evaluate the vascular anatomy. CONTRAST:  60 mL of Isovue 370 intravenous contrast COMPARISON:  Current chest radiograph. FINDINGS: Cardiovascular: Satisfactory opacification of the pulmonary arteries to the  segmental level. No evidence of pulmonary embolism. Normal heart size. No pericardial effusion. There are minor coronary artery calcifications. The thoracic aorta is normal in caliber. There is atherosclerosis along the aortic arch and descending thoracic aorta and at the origin of the branch vessels. No significant stenosis. Mediastinum/Nodes: No enlarged mediastinal, hilar, or axillary lymph nodes. Thyroid gland, trachea, and esophagus demonstrate no significant findings. Lungs/Pleura: No acute findings. No evidence of pneumonia or pulmonary edema. There several small pulmonary nodules, largest 5 mm peripheral right lower lobe nodule, image 76, series 6. No pleural effusion. No pneumothorax. Upper Abdomen: No acute abnormality. Musculoskeletal: No acute fracture. No osteoblastic or osteolytic lesions. Review of the MIP images confirms the above findings. IMPRESSION: 1. No evidence of a pulmonary embolism. 2. No acute findings. 3. Small pulmonary nodules, almost certainly benign. No specific follow-up recommended in this age group. Electronically Signed   By: Lajean Manes M.D.   On: 02/23/2017 13:17   ASSESSMENT AND PLAN:  Gregory Valdez  is a 83 y.o. male with a known history of Rectal cancer, hypertension, hypothyroidism, previous history of prostate cancer as well as history of colostomy due to on colonic bleed, who presents to the emergency room with complaint of having cough and weakness. Patient is currently followed by hospice and he's been having these symptoms ongoing for the past few days progressively worse.  1. Acute renal failure this is due to dehydration  - will stop his losartan HCTZ Give him IV fluids monitor renal function -creat 2.15--1.59  2. Hypokalemia we'll replace potassium  3. generalized weakness Pt appears fatigued and weak. Will get PT involved  4. Essential hypertension continue clonidine and I will discontinue his losartan HCTZ  5. Rectal cancer followed by hospice  prognosis is poor -Palliative care to see pt. Spoke with Roanna Epley at length with dter and wife   Case discussed with Care Management/Social Worker. Management plans discussed with the patient, family and they are in agreement.  CODE STATUS:DNR  DVT Prophylaxis: lovenox  TOTAL TIME TAKING CARE OF THIS PATIENT: *30* minutes.  >50% time spent on counselling and coordination of care  POSSIBLE D/C IN 1-2  DAYS, DEPENDING ON CLINICAL CONDITION.  Note: This dictation was prepared with Dragon dictation along with smaller phrase technology. Any transcriptional errors that result from this process are unintentional.  Rennae Ferraiolo M.D on 02/24/2017 at 5:38 PM  Between 7am to 6pm - Pager - [phone]  After 6pm go to [email or web address] - password [email or web address] [facility]  Office  [phone]  CC: Primary care physician; Cletis Athens, MD

## 2017-02-24 NOTE — Progress Notes (Signed)
Initial Nutrition Assessment  DOCUMENTATION CODES:   Not applicable  INTERVENTION:  Recommend liberalizing diet from Heart Healthy to Regular in setting of poor appetite, advanced age.  Provide Ensure Enlive po TID, each supplement provides 350 kcal and 20 grams of protein. Patient prefers vanilla.  NUTRITION DIAGNOSIS:   Inadequate oral intake related to poor appetite, acute illness (acute bronchitis, acute renal failure) as evidenced by per patient/family report, meal completion < 50%.  GOAL:   Patient will meet greater than or equal to 90% of their needs  MONITOR:   PO intake, Supplement acceptance, Labs, Weight trends, I & O's  REASON FOR ASSESSMENT:   Malnutrition Screening Tool    ASSESSMENT:   83 year old male with PMHx of HTN, thyroid disease, neuropathy, prostate cancer s/p XRT, rectal cancer, colostomy due to colonic bleeding who presents with cough and weakness found to have acute renal failure due to dehydration, hypokalemia, acute bronchitis.   -Patient followed by home hospice.  Patient sleeping at time of assessment. Able to speak with family members at bedside, including daughters who care for patient. They report he has had a poor appetite for about one week when he started feeling sick. He is still able to attempt to eat breakfast and supper but is finishing <50% of meals. Patient drinks Ensure vanilla at home and enjoys them.   Daughter report patient has not lost any weight, though they are unsure of weight trends. Discussed his current weight and they report it is within his normal weight range. Per chart patient was 167-168 lbs in 08/2016 and has actually been gaining weight.   Meal Completion: 20-25%  Medications reviewed and include: famotidine, levothyroxine, NS @ 75 ml/hr.  Labs reviewed: Potassium 3.3, BUN 31, Creatinine 1.59, elevated Troponin.  Unable to complete Nutrition-Focused Physical Exam at request to not wake patient.  Patient does  not meet criteria for malnutrition at this time.   Diet Order:  Diet regular Room service appropriate? Yes; Fluid consistency: Thin  Skin:  Reviewed, no issues  Last BM:  02/24/2017 - type 7 per colostomy  Height:   Ht Readings from Last 1 Encounters:  02/23/17 5\' 11"  (1.803 m)    Weight:   Wt Readings from Last 1 Encounters:  02/23/17 173 lb 1.6 oz (78.5 kg)    Ideal Body Weight:  78.2 kg  BMI:  Body mass index is 24.14 kg/m.  Estimated Nutritional Needs:   Kcal:  1570-1860 (MSJ x 1.1-1.3)  Protein:  80-94 grams (1-1.2 grams/kg)  Fluid:  1.9 L/day (25 ml/kg)  EDUCATION NEEDS:   No education needs identified at this time  Willey Blade, MS, RD, LDN Pager: [phone] After Hours Pager: [phone]

## 2017-02-25 LAB — ALBUMIN: ALBUMIN: 2.8 g/dL — AB (ref 3.5–5.0)

## 2017-02-25 MED ORDER — CEPHALEXIN 500 MG PO CAPS
500.0000 mg | ORAL_CAPSULE | Freq: Two times a day (BID) | ORAL | Status: DC
Start: 2017-02-25 — End: 2017-02-25
  Administered 2017-02-25: 500 mg via ORAL
  Filled 2017-02-25: qty 1

## 2017-02-25 MED ORDER — AMLODIPINE BESYLATE 10 MG PO TABS
10.0000 mg | ORAL_TABLET | Freq: Every day | ORAL | Status: DC
  Administered 2017-02-25: 16:00:00 10 mg via ORAL
  Filled 2017-02-25: qty 1

## 2017-02-25 MED ORDER — PHENAZOPYRIDINE HCL 100 MG PO TABS
100.0000 mg | ORAL_TABLET | Freq: Three times a day (TID) | ORAL | 0 refills | Status: DC
Start: 2017-02-25 — End: 2017-11-11

## 2017-02-25 MED ORDER — CEPHALEXIN 500 MG PO CAPS
500.0000 mg | ORAL_CAPSULE | Freq: Two times a day (BID) | ORAL | 0 refills | Status: DC
Start: 2017-02-25 — End: 2017-11-11

## 2017-02-25 MED ORDER — AMLODIPINE BESYLATE 10 MG PO TABS: 10.0000 mg | ORAL_TABLET | Freq: Every day | ORAL | 0 refills | Status: AC

## 2017-02-25 MED ORDER — DM-GUAIFENESIN ER 30-600 MG PO TB12
1.0000 | ORAL_TABLET | Freq: Two times a day (BID) | ORAL | Status: DC
  Filled 2017-02-25 (×2): qty 1

## 2017-02-25 MED ORDER — ENSURE ENLIVE PO LIQD: 237.0000 mL | Freq: Three times a day (TID) | ORAL | 12 refills | Status: AC

## 2017-02-25 MED ORDER — DM-GUAIFENESIN ER 30-600 MG PO TB12: 1.0000 | ORAL_TABLET | Freq: Two times a day (BID) | ORAL | 0 refills | Status: DC

## 2017-02-25 NOTE — Progress Notes (Signed)
Visit made. Patient seen lying in bed, eyes closed, moaning, declined any pain medications. He states he feels "weak' and just wants to go home. Patient was tearful and cried again stating " I just want Jesus to take me home". Patient's daughter Jacqlyn Larsen and cousin present in the room. Jacqlyn Larsen was very tearful and stated " I have never seen my father cry". She did talk about the hospice home, questions answered. Patient's daughter June and granddaughter Magda Paganini in a bit later. They want to take the patient home and have declined the hospice home. Hospital care team all aware. Patient and family have declined a hospital bed. Patient will need EMS transport home per family request. He is currently nonambulatory and very weak. Hospice team updated to discharge and new prescriptions. Discharge summary to be faxed to hospice triage when posted. Thank you.  Flo Shanks RN, BSN, [facility] [place], hospital liaison [phone]

## 2017-02-25 NOTE — Care Management (Signed)
Discharge to home today per Dr. Fritzi Mandes. Followed by [facility]. Requested transportation per [employer] unit. Shelbie Ammons RN MSN CCM Care Management [phone]

## 2017-02-25 NOTE — Progress Notes (Signed)
EMS here to transport patient home with hospice-no distress noted-family at bedside

## 2017-02-25 NOTE — Progress Notes (Signed)
[facility] NAME: Cassie Henkels    MR#:  [id number]  DATE OF BIRTH:  [birth year]  SUBJECTIVE:  Came in after feeling weak and poor po intake. Pt declining  REVIEW OF SYSTEMS:   Review of Systems  Constitutional: Negative for chills, fever and weight loss.  HENT: Negative for ear discharge, ear pain and nosebleeds.   Eyes: Negative for blurred vision, pain and discharge.  Respiratory: Negative for sputum production, shortness of breath, wheezing and stridor.   Cardiovascular: Negative for chest pain, palpitations, orthopnea and PND.  Gastrointestinal: Negative for abdominal pain, diarrhea, nausea and vomiting.  Genitourinary: Positive for dysuria. Negative for frequency and urgency.  Musculoskeletal: Negative for back pain and joint pain.  Neurological: Positive for weakness. Negative for sensory change, speech change and focal weakness.  Psychiatric/Behavioral: Negative for depression and hallucinations. The patient is not nervous/anxious.    Tolerating Diet:yes Tolerating PT: pending  DRUG ALLERGIES:   Allergies  Allergen Reactions  . Sulfa Antibiotics     VITALS:  Blood pressure (!) 188/70, pulse 64, temperature 98 F (36.7 C), temperature source Oral, resp. rate 18, height 5\' 11"  (1.803 m), weight 78.5 kg (173 lb 1.6 oz), SpO2 94 %.  PHYSICAL EXAMINATION:   Physical Exam  GENERAL:  83 y.o.-year-old patient lying in the bed with no acute distress. weak EYES: Pupils equal, round, reactive to light and accommodation. No scleral icterus. Extraocular muscles intact.  HEENT: Head atraumatic, normocephalic. Oropharynx and nasopharynx clear. Dry oral mucosa NECK:  Supple, no jugular venous distention. No thyroid enlargement, no tenderness.  LUNGS: Normal breath sounds bilaterally, no wheezing, rales, rhonchi. No use of accessory muscles of respiration.  CARDIOVASCULAR: S1, S2 normal. No murmurs, rubs, or gallops.   ABDOMEN: Soft, nontender, nondistended. Bowel sounds present. No organomegaly or mass.  EXTREMITIES: No cyanosis, clubbing or edema b/l.    NEUROLOGIC: Cranial nerves II through XII are intact. No focal Motor or sensory deficits b/l.   PSYCHIATRIC:  patient is alert and oriented x 3.  SKIN: No obvious rash, lesion, or ulcer.   LABORATORY PANEL:  CBC  Recent Labs Lab 02/24/17 0158  WBC 6.4  HGB 12.2*  HCT 36.9*  PLT 153    Chemistries   Recent Labs Lab 02/24/17 0158  NA 140  K 3.3*  CL 106  CO2 27  GLUCOSE 113*  BUN 31*  CREATININE 1.59*  CALCIUM 8.0*   Cardiac Enzymes  Recent Labs Lab 02/24/17 0158  TROPONINI 0.04*   RADIOLOGY:  Dg Chest 2 View  Result Date: 02/23/2017 CLINICAL DATA:  New onset weakness today. EXAM: CHEST  2 VIEW COMPARISON:  Single-view of the chest 03/13/2005. FINDINGS: The lungs are clear. Heart size is normal. No pneumothorax or pleural effusion. Aortic atherosclerosis noted. No acute bony abnormality. IMPRESSION: No acute disease. Atherosclerosis. Electronically Signed   By: Inge Rise M.D.   On: 02/23/2017 11:49   Ct Angio Chest Pe W/cm &/or Wo Cm  Result Date: 02/23/2017 CLINICAL DATA:  Pt with increasing SOB over the last few weeks. Per family pt was started on [place] 5-days ago. Pt states increase in energy level as well. Patient has decreased renal function. Emergency room physician is aware of this and still desired the exam be performed. Study performed with a reduced dose of of intravenous contrast. EXAM: CT ANGIOGRAPHY CHEST WITH CONTRAST TECHNIQUE: Multidetector CT imaging of the chest was performed using the standard protocol during bolus administration of  intravenous contrast. Multiplanar CT image reconstructions and MIPs were obtained to evaluate the vascular anatomy. CONTRAST:  60 mL of Isovue 370 intravenous contrast COMPARISON:  Current chest radiograph. FINDINGS: Cardiovascular: Satisfactory opacification of the pulmonary  arteries to the segmental level. No evidence of pulmonary embolism. Normal heart size. No pericardial effusion. There are minor coronary artery calcifications. The thoracic aorta is normal in caliber. There is atherosclerosis along the aortic arch and descending thoracic aorta and at the origin of the branch vessels. No significant stenosis. Mediastinum/Nodes: No enlarged mediastinal, hilar, or axillary lymph nodes. Thyroid gland, trachea, and esophagus demonstrate no significant findings. Lungs/Pleura: No acute findings. No evidence of pneumonia or pulmonary edema. There several small pulmonary nodules, largest 5 mm peripheral right lower lobe nodule, image 76, series 6. No pleural effusion. No pneumothorax. Upper Abdomen: No acute abnormality. Musculoskeletal: No acute fracture. No osteoblastic or osteolytic lesions. Review of the MIP images confirms the above findings. IMPRESSION: 1. No evidence of a pulmonary embolism. 2. No acute findings. 3. Small pulmonary nodules, almost certainly benign. No specific follow-up recommended in this age group. Electronically Signed   By: Lajean Manes M.D.   On: 02/23/2017 13:17   ASSESSMENT AND PLAN:  Rhen Kawecki  is a 83 y.o. male with a known history of Rectal cancer, hypertension, hypothyroidism, previous history of prostate cancer as well as history of colostomy due to on colonic bleed, who presents to the emergency room with complaint of having cough and weakness. Patient is currently followed by hospice and he's been having these symptoms ongoing for the past few days progressively worse.  1. Acute renal failure this is due to dehydration  - will stop his losartan HCTZ Give him IV fluids monitor renal function -creat 2.15--1.59  2. Hypokalemia we'll replace potassium  3. generalized weakness Pt appears fatigued and weak. Will get PT involved -pt is overall declining and not doing well.  Palliative care to see today  4. Essential hypertension  continue clonidine and I will discontinue his  HCTZ/losartan -add amlodipine  5. Rectal cancer followed by hospice prognosis is poor -Palliative care to see pt. Spoke with Roanna Epley at length with dter and wife   Case discussed with Care Management/Social Worker. Management plans discussed with the patient, family and they are in agreement.  CODE STATUS:DNR  DVT Prophylaxis: lovenox  TOTAL TIME TAKING CARE OF THIS PATIENT: *30* minutes.  >50% time spent on counselling and coordination of care  POSSIBLE D/C IN 1-2  DAYS, DEPENDING ON CLINICAL CONDITION.  Note: This dictation was prepared with Dragon dictation along with smaller phrase technology. Any transcriptional errors that result from this process are unintentional.  Billy Turvey M.D on 02/25/2017 at 9:02 AM  Between 7am to 6pm - Pager - [phone]  After 6pm go to [email or web address] - password [email or web address] [facility]  Office  [phone]  CC: Primary care physician; Cletis Athens, MD

## 2017-02-25 NOTE — Discharge Summary (Addendum)
**Note Gregory-Identified via Obfuscation** Gregory Valdez NAME: Gregory Valdez    MR#:  [id number]  DATE OF BIRTH:  December 10, 1914  DATE OF ADMISSION:  02/23/2017 ADMITTING PHYSICIAN: Gregory Flock, MD  DATE OF DISCHARGE: 02/25/2017  PRIMARY CARE PHYSICIAN: MASOUD,JAVED, MD    ADMISSION DIAGNOSIS:  Dehydration [E86.0] Acute kidney injury ([place]) [N17.9]  DISCHARGE DIAGNOSIS:  Acute Renal failure due to poor po intake and dehydration Adult failure to thrive  SECONDARY DIAGNOSIS:   Past Medical History:  Diagnosis Date  . Cancer [facility])    Prostate cancer with XRT  . Cataract   . Hypertension   . Neuropathy   . Rectal cancer ([place])   . Thyroid disease     HOSPITAL COURSE:  [name] 82 y.o.malewith a known history of Rectal cancer, hypertension, hypothyroidism, previous history of prostate cancer as well as history of colostomy due to on colonic bleed, who presents to the emergency room with complaint of having cough and weakness. Patient is currently followed by hospice and he's been having these symptoms ongoing for the past few days progressively worse.  1. Acute renal failure this is due to dehydration  - will stop his losartan/HCTZ  -recieved IV fluids -creat 2.15--1.59  2. Hypokalemia we'll replace potassium  3. generalized weakness Pt appears fatigued and weak. -pt is overall declining and not doing well.  -Seen by Hospice nurse. Family wants to take pt home with hospice  4. Essential hypertension  -continue clonidine and I will discontinue his  HCTZ/losartan -added  amlodipine  5. Rectal cancer followed by hospice prognosis is poor  Will d/c to home with Hospice.  CONSULTS OBTAINED:    DRUG ALLERGIES:   Allergies  Allergen Reactions  . Sulfa Antibiotics     DISCHARGE MEDICATIONS:   Current Discharge Medication List    START taking these medications   Details  amLODipine (NORVASC) 10 MG tablet Take 1 tablet (10 mg total)  by mouth daily. Qty: 30 tablet, Refills: 0    cephALEXin (KEFLEX) 500 MG capsule Take 1 capsule (500 mg total) by mouth every 12 (twelve) hours. Qty: 10 capsule, Refills: 0    dextromethorphan-guaiFENesin (MUCINEX DM) 30-600 MG 12hr tablet Take 1 tablet by mouth 2 (two) times daily. Qty: 14 tablet, Refills: 0    feeding supplement, ENSURE ENLIVE, (ENSURE ENLIVE) LIQD Take 237 mLs by mouth 3 (three) times daily between meals. Qty: 237 mL, Refills: 12    phenazopyridine (PYRIDIUM) 100 MG tablet Take 1 tablet (100 mg total) by mouth 3 (three) times daily with meals. Qty: 10 tablet, Refills: 0      CONTINUE these medications which have NOT CHANGED   Details  acetaminophen (TYLENOL) 500 MG tablet Take 1,000 mg by mouth every 6 (six) hours as needed.    cloNIDine (CATAPRES) 0.1 MG tablet Take 0.1 mg by mouth 2 (two) times daily. Take 2 in the morning and 1 in the evening.    gabapentin (NEURONTIN) 100 MG capsule Take 100 mg by mouth at bedtime.    levothyroxine (SYNTHROID, LEVOTHROID) 88 MCG tablet Take 88 mcg by mouth daily before breakfast.    loratadine (CLARITIN) 10 MG tablet Take 10 mg by mouth daily.    Multiple Vitamin (MULTI VITAMIN DAILY PO) Take 1 tablet by mouth daily.     oxycodone (OXY-IR) 5 MG capsule Take 5 mg by mouth every 4 (four) hours as needed.    ranitidine (ZANTAC) 150 MG tablet Take 150 mg by mouth  as needed for heartburn.    tamsulosin (FLOMAX) 0.4 MG CAPS capsule Take 0.4 mg by mouth.      STOP taking these medications     azithromycin (ZITHROMAX) 250 MG tablet      ibuprofen (ADVIL,MOTRIN) 200 MG tablet      losartan-hydrochlorothiazide (HYZAAR) 50-12.5 MG tablet         If you experience worsening of your admission symptoms, develop shortness of breath, life threatening emergency, suicidal or homicidal thoughts you must seek medical attention immediately by calling 911 or calling your MD immediately  if symptoms less severe.  You Must read  complete instructions/literature along with all the possible adverse reactions/side effects for all the Medicines you take and that have been prescribed to you. Take any new Medicines after you have completely understood and accept all the possible adverse reactions/side effects.   Please note  You were cared for by a hospitalist during your hospital stay. If you have any questions about your discharge medications or the care you received while you were in the hospital after you are discharged, you can call the unit and asked to speak with the hospitalist on call if the hospitalist that took care of you is not available. Once you are discharged, your primary care physician will handle any further medical issues. Please note that NO REFILLS for any discharge medications will be authorized once you are discharged, as it is imperative that you return to your primary care physician (or establish a relationship with a primary care physician if you do not have one) for your aftercare needs so that they can reassess your need for medications and monitor your lab values.   DATA REVIEW:   CBC   Recent Labs Lab 02/24/17 0158  WBC 6.4  HGB 12.2*  HCT 36.9*  PLT 153    Chemistries   Recent Labs Lab 02/24/17 0158  NA 140  K 3.3*  CL 106  CO2 27  GLUCOSE 113*  BUN 31*  CREATININE 1.59*  CALCIUM 8.0*    Microbiology Results   No results found for this or any previous visit (from the past 240 hour(s)).  RADIOLOGY:  No results found.   Management plans discussed with the patient, family and they are in agreement.  CODE STATUS:     Code Status Orders        Start     Ordered   02/23/17 1628  Do not attempt resuscitation (DNR)  Continuous    Question Answer Comment  In the event of cardiac or respiratory ARREST Do not call a "code blue"   In the event of cardiac or respiratory ARREST Do not perform Intubation, CPR, defibrillation or ACLS   In the event of cardiac or respiratory  ARREST Use medication by any route, position, wound care, and other measures to relive pain and suffering. May use oxygen, suction and manual treatment of airway obstruction as needed for comfort.      02/23/17 1628    Code Status History    Date Active Date Inactive Code Status Order ID Comments User Context   This patient has a current code status but no historical code status.    Advance Directive Documentation     Most Recent Value  Type of Advance Directive  Out of facility DNR (pink MOST or yellow form)  Pre-existing out of facility DNR order (yellow form or pink MOST form)  Yellow form placed in chart (order not valid for inpatient use)  "MOST"  Form in Place?  -      TOTAL TIME TAKING CARE OF THIS PATIENT: *40* minutes.    Marcus Groll M.D on 02/25/2017 at 2:15 PM  Between 7am to 6pm - Pager - [phone] After 6pm go to [email or web address] - password [email or web address] [facility]  Office  [phone]  CC: Primary care physician; Cletis Athens, MD

## 2017-05-06 ENCOUNTER — Other Ambulatory Visit: Payer: Self-pay | Admitting: *Deleted

## 2017-05-06 NOTE — Patient Outreach (Signed)
[facility]) Care Management  05/06/2017  Gregory Valdez 1915-03-30 [id number]   Member identified as high risk through [facility].  Call placed to member for screening, spoke daughter, [name].  She state that the member has been recently diagnosed with colorectal cancer and is now on hospice.  She report that he has weekly home visits/assessments.  She denies the need for further assistance at this time.  Will not place referral, will not open case.  Valente David, [place], MSN [place] [phone]

## 2017-05-15 ENCOUNTER — Encounter: Payer: Self-pay | Admitting: *Deleted

## 2017-11-11 ENCOUNTER — Emergency Department: Payer: PPO

## 2017-11-11 ENCOUNTER — Emergency Department
Admission: EM | Admit: 2017-11-11 | Discharge: 2017-11-11 | Disposition: A | Discharge status: Home / Self Care | Payer: PPO | Attending: Emergency Medicine | Admitting: Emergency Medicine

## 2017-11-11 DIAGNOSIS — C189 Malignant neoplasm of colon, unspecified: Secondary | ICD-10-CM | POA: Insufficient documentation

## 2017-11-11 DIAGNOSIS — R05 Cough: Secondary | ICD-10-CM | POA: Insufficient documentation

## 2017-11-11 DIAGNOSIS — Z79899 Other long term (current) drug therapy: Secondary | ICD-10-CM | POA: Insufficient documentation

## 2017-11-11 DIAGNOSIS — Z515 Encounter for palliative care: Secondary | ICD-10-CM | POA: Diagnosis not present

## 2017-11-11 DIAGNOSIS — R6889 Other general symptoms and signs: Secondary | ICD-10-CM | POA: Diagnosis not present

## 2017-11-11 DIAGNOSIS — Z87891 Personal history of nicotine dependence: Secondary | ICD-10-CM | POA: Diagnosis not present

## 2017-11-11 DIAGNOSIS — J111 Influenza due to unidentified influenza virus with other respiratory manifestations: Secondary | ICD-10-CM | POA: Diagnosis not present

## 2017-11-11 DIAGNOSIS — R0602 Shortness of breath: Secondary | ICD-10-CM | POA: Diagnosis not present

## 2017-11-11 DIAGNOSIS — Z8546 Personal history of malignant neoplasm of prostate: Secondary | ICD-10-CM | POA: Insufficient documentation

## 2017-11-11 DIAGNOSIS — R69 Illness, unspecified: Secondary | ICD-10-CM

## 2017-11-11 LAB — INFLUENZA PANEL BY PCR (TYPE A & B)
INFLAPCR: NEGATIVE
INFLBPCR: NEGATIVE

## 2017-11-11 MED ORDER — BENZONATATE 100 MG PO CAPS: 100.0000 mg | ORAL_CAPSULE | Freq: Four times a day (QID) | ORAL | 0 refills | Status: AC | PRN

## 2017-11-11 MED ORDER — IPRATROPIUM BROMIDE 0.03 % NA SOLN: 2.0000 | Freq: Three times a day (TID) | NASAL | 0 refills | Status: AC

## 2017-11-11 NOTE — Progress Notes (Addendum)
ED visit made. Patient is currently followed by Hospice and [facility] at home with a hospice diagnosis of rectal cancer. He is a DNR code with an out of facility DNR form in place at home. Patient was sent to the [facility] ED via EMS for evaluation of a cough and increased weakness at the insistence of his daughters. He is currently taking azithromycin for a suspected respiratory infection.  Patient seen sitting up on the ED stretcher, alert and oriented. Patient stated he really did "not want to come to the hospital". Per discussion with attending physician Dr. Mable Paris a chest xray and influenza test will be performed, if negative plan will be for discharge. Patient was agreeable to this. Writer returned a bit later and patient's daughter June was present as was attending Dr. Mable Paris. All tests performed were negative for any acute process. Patient will finish his current antibiotic. Plan is for discharge home via EMS as patient is unable to walk up the stairs at his home. June aware and in agreement with plan. Hospice team updated. Hospital notes faxed to triage.  Flo Shanks RN, BSN, [facility] liaison [phone]

## 2017-11-11 NOTE — ED Triage Notes (Signed)
Patient to ED from home c/o SOB. Per EMS patient has had SOB, weakness, and productive cough for several weeks. Patient reports dyspnea while ambulating and at rest. Patient denies NVD and denies pain. Patient has a hx of rectal cancer and is currently under hospice care. No acute distress noted.

## 2017-11-11 NOTE — Discharge Instructions (Signed)
Fortunately today your chest x-ray was reassuring and you do not have pneumonia.  Please use your cough medication as needed for severe symptoms and follow-up with your primary care physician as needed.  It was a pleasure to take care of you today, and thank you for coming to our emergency department.  If you have any questions or concerns before leaving please ask the nurse to grab me and I'm more than happy to go through your aftercare instructions again.  If you were prescribed any opioid pain medication today such as Norco, Vicodin, Percocet, morphine, hydrocodone, or oxycodone please make sure you do not drive when you are taking this medication as it can alter your ability to drive safely.  If you have any concerns once you are home that you are not improving or are in fact getting worse before you can make it to your follow-up appointment, please do not hesitate to call 911 and come back for further evaluation.  Darel Hong, MD  Results for orders placed or performed during the hospital encounter of 40/98/11  Basic metabolic panel  Result Value Ref Range   Sodium 139 135 - 145 mmol/L   Potassium 3.2 (L) 3.5 - 5.1 mmol/L   Chloride 99 (L) 101 - 111 mmol/L   CO2 30 22 - 32 mmol/L   Glucose, Bld 135 (H) 65 - 99 mg/dL   BUN 34 (H) 6 - 20 mg/dL   Creatinine, Ser 2.15 (H) 0.61 - 1.24 mg/dL   Calcium 8.6 (L) 8.9 - 10.3 mg/dL   GFR calc non Af Amer 24 (L) >60 mL/min   GFR calc Af Amer 28 (L) >60 mL/min   Anion gap 10 5 - 15  CBC  Result Value Ref Range   WBC 6.2 3.8 - 10.6 K/uL   RBC 4.78 4.40 - 5.90 MIL/uL   Hemoglobin 13.2 13.0 - 18.0 g/dL   HCT 40.5 40.0 - 52.0 %   MCV 84.7 80.0 - 100.0 fL   MCH 27.5 26.0 - 34.0 pg   MCHC 32.5 32.0 - 36.0 g/dL   RDW 15.1 (H) 11.5 - 14.5 %   Platelets 186 150 - 440 K/uL  Urinalysis, Complete w Microscopic  Result Value Ref Range   Color, Urine YELLOW (A) YELLOW   APPearance CLEAR (A) CLEAR   Specific Gravity, Urine 1.018 1.005 - 1.030   pH  6.0 5.0 - 8.0   Glucose, UA NEGATIVE NEGATIVE mg/dL   Hgb urine dipstick NEGATIVE NEGATIVE   Bilirubin Urine NEGATIVE NEGATIVE   Ketones, ur NEGATIVE NEGATIVE mg/dL   Protein, ur NEGATIVE NEGATIVE mg/dL   Nitrite NEGATIVE NEGATIVE   Leukocytes, UA NEGATIVE NEGATIVE   RBC / HPF 0-5 0 - 5 RBC/hpf   WBC, UA 6-30 0 - 5 WBC/hpf   Bacteria, UA NONE SEEN NONE SEEN   Squamous Epithelial / LPF NONE SEEN NONE SEEN  Troponin I  Result Value Ref Range   Troponin I 0.04 (HH) <0.03 ng/mL  Brain natriuretic peptide  Result Value Ref Range   B Natriuretic Peptide 81.0 0.0 - 100.0 pg/mL  Sodium, urine, random  Result Value Ref Range   Sodium, Ur 54 mmol/L  Creatinine, urine, random  Result Value Ref Range   Creatinine, Urine 97 mg/dL  Troponin I  Result Value Ref Range   Troponin I 0.04 (HH) <0.03 ng/mL  Troponin I  Result Value Ref Range   Troponin I 0.04 (HH) <0.03 ng/mL  Basic metabolic panel  Result Value Ref  Range   Sodium 140 135 - 145 mmol/L   Potassium 3.3 (L) 3.5 - 5.1 mmol/L   Chloride 106 101 - 111 mmol/L   CO2 27 22 - 32 mmol/L   Glucose, Bld 113 (H) 65 - 99 mg/dL   BUN 31 (H) 6 - 20 mg/dL   Creatinine, Ser 1.59 (H) 0.61 - 1.24 mg/dL   Calcium 8.0 (L) 8.9 - 10.3 mg/dL   GFR calc non Af Amer 34 (L) >60 mL/min   GFR calc Af Amer 40 (L) >60 mL/min   Anion gap 7 5 - 15  CBC  Result Value Ref Range   WBC 6.4 3.8 - 10.6 K/uL   RBC 4.31 (L) 4.40 - 5.90 MIL/uL   Hemoglobin 12.2 (L) 13.0 - 18.0 g/dL   HCT 36.9 (L) 40.0 - 52.0 %   MCV 85.6 80.0 - 100.0 fL   MCH 28.3 26.0 - 34.0 pg   MCHC 33.0 32.0 - 36.0 g/dL   RDW 15.0 (H) 11.5 - 14.5 %   Platelets 153 150 - 440 K/uL  Albumin  Result Value Ref Range   Albumin 2.8 (L) 3.5 - 5.0 g/dL   Dg Chest 2 View  Result Date: 11/11/2017 CLINICAL DATA:  Several week history of shortness of breath, productive cough, exertional and rest dyspnea. History of rectal malignancy and is currently under hospice care. Former smoker. EXAM: CHEST   2 VIEW COMPARISON:  CT scan of the chest of February 23, 2017 and PA and lateral chest x-ray of February 23, 2017. FINDINGS: The lungs are well-expanded. The interstitial markings are mildly prominent but stable. There is no alveolar infiltrate or pleural effusion. The heart and pulmonary vascularity are normal. There calcification in the wall of the aortic arch and descending thoracic aorta. The bony thorax exhibits no acute abnormality. IMPRESSION: COPD. No pneumonia, CHF, nor other acute cardiopulmonary abnormality. No evidence of metastatic disease to the thorax. Thoracic aortic atherosclerosis. Electronically Signed   By: David  Martinique M.D.   On: 11/11/2017 13:16

## 2017-11-11 NOTE — ED Provider Notes (Signed)
[facility] Emergency Department Provider Note  ____________________________________________   First MD Initiated Contact with Patient 11/11/17 1229     (approximate)  I have reviewed the triage vital signs and the nursing notes.   HISTORY  Chief Complaint Shortness of Breath   HPI Gregory Valdez is a 84 y.o. male is brought to the emergency department by EMS against his will for cough and shortness of breath.  He has been sick for the past 10 days or so ever since December 28.  He has a past medical history of metastatic colon cancer and is currently on hospice care.  His hospice nurse 4 days ago prescribed him azithromycin which he has noted no improvement with.  He had some increasing shortness of breath today so his daughter called 911.  The patient did not want to come to the hospital, however relented at his daughter's insistence.  He denies fevers or chills.  His primary concern is his dry cough worse at night along with some nasal congestion.  His symptoms had insidious onset and have been daily and intermittent.  They are worse at night and somewhat improved throughout the day.  Past Medical History:  Diagnosis Date  . Cancer [facility])    Prostate cancer with XRT  . Cataract   . Hypertension   . Neuropathy   . Rectal cancer ([place])   . Thyroid disease     Patient Active Problem List   Diagnosis Date Noted  . Acute renal failure (ARF) ([place]) 02/23/2017  . Rectal mass 08/13/2016    Past Surgical History:  Procedure Laterality Date  . COLON RESECTION    . COLONOSCOPY  1995  . eliostomy  1995  . EYE SURGERY    . HERNIA REPAIR    . PROSTATE SURGERY      Prior to Admission medications   Medication Sig Start Date End Date Taking? Authorizing Provider  feeding supplement, ENSURE ENLIVE, (ENSURE ENLIVE) LIQD Take 237 mLs by mouth 3 (three) times daily between meals. 02/25/17  Yes Fritzi Mandes, MD  acetaminophen (TYLENOL) 500 MG tablet Take 1,000 mg  by mouth every 6 (six) hours as needed.    [provider]  amLODipine (NORVASC) 10 MG tablet Take 1 tablet (10 mg total) by mouth daily. 02/26/17   Fritzi Mandes, MD  benzonatate (TESSALON PERLES) 100 MG capsule Take 1 capsule (100 mg total) by mouth every 6 (six) hours as needed for cough. 11/11/17 11/11/18  Darel Hong, MD  cloNIDine (CATAPRES) 0.1 MG tablet Take 0.1 mg by mouth 2 (two) times daily. Take 2 in the morning and 1 in the evening.    [provider]  gabapentin (NEURONTIN) 100 MG capsule Take 100 mg by mouth at bedtime.    [provider]  ipratropium (ATROVENT) 0.03 % nasal spray Place 2 sprays into the nose 3 (three) times daily. 11/11/17 11/11/18  Darel Hong, MD  levothyroxine (SYNTHROID, LEVOTHROID) 88 MCG tablet Take 88 mcg by mouth daily before breakfast.    [provider]  loratadine (CLARITIN) 10 MG tablet Take 10 mg by mouth daily.    [provider]  Multiple Vitamin (MULTI VITAMIN DAILY PO) Take 1 tablet by mouth daily.     [provider]  oxycodone (OXY-IR) 5 MG capsule Take 5 mg by mouth every 4 (four) hours as needed.    [provider]  ranitidine (ZANTAC) 150 MG tablet Take 150 mg by mouth as needed for heartburn.  [provider]  tamsulosin (FLOMAX) 0.4 MG CAPS capsule Take 0.4 mg by mouth.    [provider]    Allergies Sulfa antibiotics  Family History  Family history unknown: Yes    Social History Social History   Tobacco Use  . Smoking status: Former Smoker    Years: 30.00  . Smokeless tobacco: Never Used  Substance Use Topics  . Alcohol use: No  . Drug use: No    Review of Systems Constitutional: No fever/chills Eyes: No visual changes. ENT: No sore throat. Cardiovascular: Denies chest pain. Respiratory: Positive for cough. Gastrointestinal: No abdominal pain.  No nausea, no vomiting.  No diarrhea.  No constipation. Genitourinary: Negative for  dysuria. Musculoskeletal: Negative for back pain. Skin: Negative for rash. Neurological: Negative for headaches, focal weakness or numbness.   ____________________________________________   PHYSICAL EXAM:  VITAL SIGNS: ED Triage Vitals  Enc Vitals Group     BP      Pulse      Resp      Temp      Temp src      SpO2      Weight      Height      Head Circumference      Peak Flow      Pain Score      Pain Loc      Pain Edu?      Excl. in [place]?     Constitutional: Alert and oriented x4 joking laughing well-appearing nontoxic no diaphoresis speaks in full clear sentences Eyes: PERRL EOMI. Head: Atraumatic. Nose: No congestion/rhinnorhea. Mouth/Throat: No trismus uvula midline no pharyngeal erythema or exudate Neck: No stridor.   Cardiovascular: Normal rate, regular rhythm. Grossly normal heart sounds.  Good peripheral circulation. Respiratory: Normal respiratory effort.  No retractions. Lungs CTAB and moving good air Gastrointestinal: Soft nontender Musculoskeletal: No lower extremity edema   Neurologic:  Normal speech and language. No gross focal neurologic deficits are appreciated. Skin:  Skin is warm, dry and intact. No rash noted. Psychiatric: Mood and affect are normal. Speech and behavior are normal.    ____________________________________________   DIFFERENTIAL includes but not limited to  Influenza, influenza-like illness, pneumonia, pneumothorax, pulmonary embolism ____________________________________________   LABS (all labs ordered are listed, but only abnormal results are displayed)  Labs Reviewed  INFLUENZA PANEL BY PCR (TYPE A & B)    Influenza test is pending __________________________________________  EKG   ____________________________________________  RADIOLOGY  Chest x-ray reviewed by me with no acute disease ____________________________________________   PROCEDURES  Procedure(s) performed: no  Procedures  Critical Care  performed: no  Observation: no ____________________________________________   INITIAL IMPRESSION / ASSESSMENT AND PLAN / ED COURSE  Pertinent labs & imaging results that were available during my care of the patient were reviewed by me and considered in my medical decision making (see chart for details).  The patient is saturating 97% with normal work of breathing.  Urgently his chest x-ray is negative for infiltrate.  His lungs are clear to auscultation.  I do lengthy discussion with the patient and his daughter at bedside regarding the diagnosis of influenza-like illness and that even if the patient had true influenza he would not be a candidate for treatment as his symptoms have been greater than 10 days.  I have checked him at the family's request just for their knowledge.  I will call him back with results.  At this point he is medically stable for outpatient management with [facility]  Perles and Atrovent nasal spray for symptomatic relief.  Strict return precautions have been given.      ____________________________________________   FINAL CLINICAL IMPRESSION(S) / ED DIAGNOSES  Final diagnoses:  Influenza-like illness      NEW MEDICATIONS STARTED DURING THIS VISIT:  This SmartLink is deprecated. Use AVSMEDLIST instead to display the medication list for a patient.   Note:  This document was prepared using Dragon voice recognition software and may include unintentional dictation errors.     Darel Hong, MD 11/11/17 1336

## 2017-12-21 ENCOUNTER — Other Ambulatory Visit: Payer: Self-pay

## 2017-12-21 ENCOUNTER — Emergency Department
Admission: EM | Admit: 2017-12-21 | Discharge: 2017-12-21 | Disposition: A | Discharge status: Home / Self Care | Attending: Student in an Organized Health Care Education/Training Program | Admitting: Student in an Organized Health Care Education/Training Program

## 2017-12-21 DIAGNOSIS — I10 Essential (primary) hypertension: Secondary | ICD-10-CM | POA: Insufficient documentation

## 2017-12-21 DIAGNOSIS — Z8546 Personal history of malignant neoplasm of prostate: Secondary | ICD-10-CM | POA: Insufficient documentation

## 2017-12-21 DIAGNOSIS — Z85048 Personal history of other malignant neoplasm of rectum, rectosigmoid junction, and anus: Secondary | ICD-10-CM | POA: Diagnosis not present

## 2017-12-21 DIAGNOSIS — Z466 Encounter for fitting and adjustment of urinary device: Secondary | ICD-10-CM | POA: Diagnosis not present

## 2017-12-21 DIAGNOSIS — R339 Retention of urine, unspecified: Secondary | ICD-10-CM | POA: Diagnosis not present

## 2017-12-21 DIAGNOSIS — N3289 Other specified disorders of bladder: Secondary | ICD-10-CM | POA: Diagnosis not present

## 2017-12-21 DIAGNOSIS — Z79899 Other long term (current) drug therapy: Secondary | ICD-10-CM | POA: Insufficient documentation

## 2017-12-21 DIAGNOSIS — Z87891 Personal history of nicotine dependence: Secondary | ICD-10-CM | POA: Insufficient documentation

## 2017-12-21 DIAGNOSIS — N3001 Acute cystitis with hematuria: Secondary | ICD-10-CM | POA: Insufficient documentation

## 2017-12-21 DIAGNOSIS — R338 Other retention of urine: Secondary | ICD-10-CM | POA: Diagnosis not present

## 2017-12-21 LAB — CBC
HCT: 36.3 % — ABNORMAL LOW (ref 40.0–52.0)
Hemoglobin: 11.8 g/dL — ABNORMAL LOW (ref 13.0–18.0)
MCH: 26.6 pg (ref 26.0–34.0)
MCHC: 32.6 g/dL (ref 32.0–36.0)
MCV: 81.6 fL (ref 80.0–100.0)
PLATELETS: 349 10*3/uL (ref 150–440)
RBC: 4.44 MIL/uL (ref 4.40–5.90)
RDW: 17.3 % — AB (ref 11.5–14.5)
WBC: 10.7 10*3/uL — ABNORMAL HIGH (ref 3.8–10.6)

## 2017-12-21 LAB — URINALYSIS, COMPLETE (UACMP) WITH MICROSCOPIC
BILIRUBIN URINE: NEGATIVE
GLUCOSE, UA: NEGATIVE mg/dL
KETONES UR: NEGATIVE mg/dL
NITRITE: NEGATIVE
PROTEIN: 100 mg/dL — AB
Specific Gravity, Urine: 1.012 (ref 1.005–1.030)
Squamous Epithelial / LPF: NONE SEEN
pH: 7 (ref 5.0–8.0)

## 2017-12-21 LAB — BASIC METABOLIC PANEL
Anion gap: 10 (ref 5–15)
BUN: 27 mg/dL — AB (ref 6–20)
CALCIUM: 8.9 mg/dL (ref 8.9–10.3)
CO2: 23 mmol/L (ref 22–32)
CREATININE: 1.17 mg/dL (ref 0.61–1.24)
Chloride: 104 mmol/L (ref 101–111)
GFR calc Af Amer: 57 mL/min — ABNORMAL LOW (ref >60)
GFR, EST NON AFRICAN AMERICAN: 49 mL/min — AB (ref >60)
GLUCOSE: 184 mg/dL — AB (ref 65–99)
Potassium: 3.8 mmol/L (ref 3.5–5.1)
Sodium: 137 mmol/L (ref 135–145)

## 2017-12-21 MED ORDER — MORPHINE SULFATE (PF) 4 MG/ML IV SOLN
INTRAVENOUS | Status: AC
  Administered 2017-12-21: 4 mg
  Filled 2017-12-21: qty 1

## 2017-12-21 MED ORDER — MORPHINE SULFATE (PF) 2 MG/ML IV SOLN
2.0000 mg | Freq: Once | INTRAVENOUS | Status: AC
  Administered 2017-12-21: 2 mg via INTRAVENOUS

## 2017-12-21 MED ORDER — LIDOCAINE HCL 2 % EX GEL
1.0000 "application " | Freq: Once | CUTANEOUS | Status: AC
  Administered 2017-12-21: 1 via URETHRAL
  Filled 2017-12-21: qty 10

## 2017-12-21 MED ORDER — ONDANSETRON HCL 4 MG/2ML IJ SOLN
INTRAMUSCULAR | Status: AC
  Administered 2017-12-21: 4 mg
  Filled 2017-12-21: qty 2

## 2017-12-21 MED ORDER — MORPHINE SULFATE (PF) 2 MG/ML IV SOLN
INTRAVENOUS | Status: AC
  Administered 2017-12-21: 2 mg via INTRAVENOUS
  Filled 2017-12-21: qty 1

## 2017-12-21 MED ORDER — CEPHALEXIN 500 MG PO CAPS: 500.0000 mg | ORAL_CAPSULE | Freq: Three times a day (TID) | ORAL | 0 refills | Status: AC

## 2017-12-21 MED ORDER — SODIUM CHLORIDE 0.9 % IV SOLN
2.0000 g | Freq: Once | INTRAVENOUS | Status: AC
  Administered 2017-12-21: 2 g via INTRAVENOUS
  Filled 2017-12-21: qty 20

## 2017-12-21 NOTE — Consult Note (Addendum)
H&P Physician requesting consult: Merlyn Lot, MD  Chief Complaint: Urinary retention, difficult catheter  History of Present Illness: 84 year old male veteran with a history of BPH status post TURP in the remote past as well as a history of prostate cancer status post external beam radiation therapy in the 1990s presented with urinary retention.  For the past 2 or 3 weeks he has had progressive difficulty voiding.  For the past 2 or 3 days he is also had some hematuria.  In the emergency department, nursing was unable to pass a catheter after multiple attempts.  He was noted to have blood clots per urethra.  Bedside ultrasound by the emergency department was reported to have blood clots within the bladder.  The patient himself is currently on hospice for colon cancer.  Past Medical History:  Diagnosis Date  . Cancer [facility])    Prostate cancer with XRT  . Cataract   . Hypertension   . Neuropathy   . Rectal cancer ([place])   . Thyroid disease    Past Surgical History:  Procedure Laterality Date  . COLON RESECTION    . COLONOSCOPY  1995  . eliostomy  1995  . EYE SURGERY    . HERNIA REPAIR    . PROSTATE SURGERY      Home Medications:   (Not in a hospital admission) Allergies:  Allergies  Allergen Reactions  . Sulfa Antibiotics     Family History  Family history unknown: Yes   Social History:  reports that he has quit smoking. He quit after 30.00 years of use. he has never used smokeless tobacco. He reports that he does not drink alcohol or use drugs.  ROS: A complete review of systems was performed.  All systems are negative except for pertinent findings as noted. ROS   Physical Exam:  Vital signs in last 24 hours: Temp:  [97.9 F (36.6 C)] 97.9 F (36.6 C) (02/17 1525) Pulse Rate:  [95] 95 (02/17 1525) Resp:  [24] 24 (02/17 1525) BP: (167)/(138) 167/138 (02/17 1525) SpO2:  [100 %] 100 % (02/17 1525)  General:  Alert and oriented, initially in moderate distress  secondary to urinary retention but subsequently no distress after I placed the catheter.   HEENT: Normocephalic, atraumatic Neck: No JVD or lymphadenopathy Cardiovascular: Regular rate and rhythm on the monitor Lungs: Regular rate and effort Abdomen: Soft, nontender, nondistended, no abdominal masses after catheter placement, ostomy bag productive GU: Uncircumcised phallus. Extremities: No edema Neurologic: Grossly intact  Laboratory Data:  Results for orders placed or performed during the hospital encounter of 12/21/17 (from the past 24 hour(s))  Basic metabolic panel     Status: Abnormal   Collection Time: 12/21/17  3:20 PM  Result Value Ref Range   Sodium 137 135 - 145 mmol/L   Potassium 3.8 3.5 - 5.1 mmol/L   Chloride 104 101 - 111 mmol/L   CO2 23 22 - 32 mmol/L   Glucose, Bld 184 (H) 65 - 99 mg/dL   BUN 27 (H) 6 - 20 mg/dL   Creatinine, Ser 1.17 0.61 - 1.24 mg/dL   Calcium 8.9 8.9 - 10.3 mg/dL   GFR calc non Af Amer 49 (L) >60 mL/min   GFR calc Af Amer 57 (L) >60 mL/min   Anion gap 10 5 - 15  CBC     Status: Abnormal   Collection Time: 12/21/17  3:20 PM  Result Value Ref Range   WBC 10.7 (H) 3.8 - 10.6 K/uL   RBC  4.44 4.40 - 5.90 MIL/uL   Hemoglobin 11.8 (L) 13.0 - 18.0 g/dL   HCT 36.3 (L) 40.0 - 52.0 %   MCV 81.6 80.0 - 100.0 fL   MCH 26.6 26.0 - 34.0 pg   MCHC 32.6 32.0 - 36.0 g/dL   RDW 17.3 (H) 11.5 - 14.5 %   Platelets 349 150 - 440 K/uL   No results found for this or any previous visit (from the past 240 hour(s)). Creatinine: Recent Labs    12/21/17 1520  CREATININE 1.17   Procedure: The patient was prepped and draped.  Under sterile conditions, I decided to use a 24 [place] coud tip three-way hematuria catheter in preparation for possible need for bladder irrigation and continuous bladder irrigation, given the report of blood clots per urethra as well as on ultrasound.  I made a moderate amount of resistance but I was able to navigate the catheter into the  bladder.  There was return of tea colored urine with no significant hematuria.  No evidence of significant clot burden.  10 cc of sterile water was placed into the catheter balloon.  It drained about 500 cc.  Patient had immediate relief.  The third port was capped.  Impression/Assessment:  Urinary retention  Plan:  Keep Foley catheter and follow-up outpatient.  He already takes tamsulosin.  Given that he is on hospice, he may not be a good surgical candidate.  If he tolerates the catheter fine, he would be a better candidate for monthly catheter changes.  Can be discharged from emergency department from a urological standpoint.  Marton Redwood, III 12/21/2017, 4:46 PM

## 2017-12-21 NOTE — ED Provider Notes (Signed)
[facility] Emergency Department Provider Note    First MD Initiated Contact with Patient 12/21/17 1520     (approximate)  I have reviewed the triage vital signs and the nursing notes.   HISTORY  Chief Complaint Urinary Retention    HPI Gregory Valdez is a 84 y.o. male with a history of prostate cancer status post XRT presents with hematuria times 1 day and inability to empty his bladder since this morning.  States that he had a few dribbles of urine that was blood-tinged first in the morning started having worsening suprapubic pain.  Here from nursing facility they tried to pass a Foley but were unable to.  He was sent to the ER for further evaluation.  Scribes the pain as moderate to severe.  No recent fevers.  Is not on any blood thinners.  Past Medical History:  Diagnosis Date  . Cancer [facility])    Prostate cancer with XRT  . Cataract   . Hypertension   . Neuropathy   . Rectal cancer ([place])   . Thyroid disease    Family History  Family history unknown: Yes   Past Surgical History:  Procedure Laterality Date  . COLON RESECTION    . COLONOSCOPY  1995  . eliostomy  1995  . EYE SURGERY    . HERNIA REPAIR    . PROSTATE SURGERY     Patient Active Problem List   Diagnosis Date Noted  . Acute renal failure (ARF) ([place]) 02/23/2017  . Rectal mass 08/13/2016      Prior to Admission medications   Medication Sig Start Date End Date Taking? Authorizing Provider  acetaminophen (TYLENOL) 500 MG tablet Take 1,000 mg by mouth every 6 (six) hours as needed.    [provider]  amLODipine (NORVASC) 10 MG tablet Take 1 tablet (10 mg total) by mouth daily. 02/26/17   Fritzi Mandes, MD  benzonatate (TESSALON PERLES) 100 MG capsule Take 1 capsule (100 mg total) by mouth every 6 (six) hours as needed for cough. 11/11/17 11/11/18  Darel Hong, MD  cloNIDine (CATAPRES) 0.1 MG tablet Take 0.1 mg by mouth 2 (two) times daily. Take 2 in the morning and 1 in  the evening.    [provider]  feeding supplement, ENSURE ENLIVE, (ENSURE ENLIVE) LIQD Take 237 mLs by mouth 3 (three) times daily between meals. 02/25/17   Fritzi Mandes, MD  gabapentin (NEURONTIN) 100 MG capsule Take 100 mg by mouth at bedtime.    [provider]  ipratropium (ATROVENT) 0.03 % nasal spray Place 2 sprays into the nose 3 (three) times daily. 11/11/17 11/11/18  Darel Hong, MD  levothyroxine (SYNTHROID, LEVOTHROID) 88 MCG tablet Take 88 mcg by mouth daily before breakfast.    [provider]  loratadine (CLARITIN) 10 MG tablet Take 10 mg by mouth daily.    [provider]  Multiple Vitamin (MULTI VITAMIN DAILY PO) Take 1 tablet by mouth daily.     [provider]  oxycodone (OXY-IR) 5 MG capsule Take 5 mg by mouth every 4 (four) hours as needed.    [provider]  ranitidine (ZANTAC) 150 MG tablet Take 150 mg by mouth as needed for heartburn.    [provider]  tamsulosin (FLOMAX) 0.4 MG CAPS capsule Take 0.4 mg by mouth.    [provider]    Allergies Sulfa antibiotics    Social History Social History   Tobacco Use  . Smoking status: Former Smoker  Years: 30.00  . Smokeless tobacco: Never Used  Substance Use Topics  . Alcohol use: No  . Drug use: No    Review of Systems Patient denies headaches, rhinorrhea, blurry vision, numbness, shortness of breath, chest pain, edema, cough, abdominal pain, nausea, vomiting, diarrhea, dysuria, fevers, rashes or hallucinations unless otherwise stated above in HPI. ____________________________________________   PHYSICAL EXAM:  VITAL SIGNS: Vitals:   12/21/17 1525  BP: (!) 167/138  Pulse: 95  Resp: (!) 24  Temp: 97.9 F (36.6 C)  SpO2: 100%    Constitutional: Alert and oriented. Well appearing and in no acute distress. Eyes: Conjunctivae are normal.  Head: Atraumatic. Nose: No congestion/rhinnorhea. Mouth/Throat: Mucous membranes are moist.    Neck: No stridor. Painless ROM.  Cardiovascular: Normal rate, regular rhythm. Grossly normal heart sounds.  Good peripheral circulation. Respiratory: Normal respiratory effort.  No retractions. Lungs CTAB. Gastrointestinal: Soft and nontender. No distention. No abdominal bruits. No CVA tenderness. Genitourinary: blood from urethral meatus, otherwise normal external genitalia Musculoskeletal: No lower extremity tenderness nor edema.  No joint effusions. Neurologic:  Normal speech and language. No gross focal neurologic deficits are appreciated. No facial droop Skin:  Skin is warm, dry and intact. No rash noted. Psychiatric: Mood and affect are normal. Speech and behavior are normal.  ____________________________________________   LABS (all labs ordered are listed, but only abnormal results are displayed)  Results for orders placed or performed during the hospital encounter of 12/21/17 (from the past 24 hour(s))  Basic metabolic panel     Status: Abnormal   Collection Time: 12/21/17  3:20 PM  Result Value Ref Range   Sodium 137 135 - 145 mmol/L   Potassium 3.8 3.5 - 5.1 mmol/L   Chloride 104 101 - 111 mmol/L   CO2 23 22 - 32 mmol/L   Glucose, Bld 184 (H) 65 - 99 mg/dL   BUN 27 (H) 6 - 20 mg/dL   Creatinine, Ser 1.17 0.61 - 1.24 mg/dL   Calcium 8.9 8.9 - 10.3 mg/dL   GFR calc non Af Amer 49 (L) >60 mL/min   GFR calc Af Amer 57 (L) >60 mL/min   Anion gap 10 5 - 15  CBC     Status: Abnormal   Collection Time: 12/21/17  3:20 PM  Result Value Ref Range   WBC 10.7 (H) 3.8 - 10.6 K/uL   RBC 4.44 4.40 - 5.90 MIL/uL   Hemoglobin 11.8 (L) 13.0 - 18.0 g/dL   HCT 36.3 (L) 40.0 - 52.0 %   MCV 81.6 80.0 - 100.0 fL   MCH 26.6 26.0 - 34.0 pg   MCHC 32.6 32.0 - 36.0 g/dL   RDW 17.3 (H) 11.5 - 14.5 %   Platelets 349 150 - 440 K/uL   ____________________________________________ ____________________________________________  RADIOLOGY  EMERGENCY DEPARTMENT ULTRASOUND  Study: Limited  Ultrasound of Bladder  INDICATIONS: to assess for urinary retention and/or bladder volume prior to urinary catheter Multiple views of the bladder were obtained in real-time in the transverse and longitudinal planes with a multi-frequency probe.  PERFORMED BY: Myself IMAGES ARCHIVED?: Yes LIMITATIONS:   INTERPRETATION: Large Volume  With large clot burden       ____________________________________________   PROCEDURES  Procedure(s) performed:  Procedures    Critical Care performed: no ____________________________________________   INITIAL IMPRESSION / ASSESSMENT AND PLAN / ED COURSE  Pertinent labs & imaging results that were available during my care of the patient were reviewed by me and considered in my medical decision making (  see chart for details).  DDX: acute urinary retention, uti, mass, prostatitis  Gregory Valdez is a 84 y.o. who presents to the ED with acute urinary retention.  Febrile but is in obvious discomfort.  Will provide IV pain medication.  Foley catheter for bladder decompression.  Clinical Course as of Dec 22 1611  Sun Dec 21, 2017  1610 Made several attempts at decompression with large bore to a three-way Foley catheters including daily without success.  It seems that the Foley catheter despite passing through the prostate is becoming clogged by a clot burden.  Based on his discomfort age and concern for urinary distention consulted urology for cystoscopic evaluation  [PR]    Clinical Course User Index [PR] Merlyn Lot, MD   Urology evaluate patient at bedside and was able to pass a 24 [place] coud three-way catheter.  No significant clot or hematuria removed therefore hyperechoic abnormality on ultrasound was consistent with enlarged prostate and given his history possible malignancy polyp.  Patient with significant improvement in symptoms.  Did also have evidence of nitrite positive UTI therefore was given IV antibiotics and will be  discharged on Keflex for outpatient follow-up.  ____________________________________________   FINAL CLINICAL IMPRESSION(S) / ED DIAGNOSES  Final diagnoses:  Urinary retention  Acute cystitis with hematuria      NEW MEDICATIONS STARTED DURING THIS VISIT:  New Prescriptions   No medications on file     Note:  This document was prepared using Dragon voice recognition software and may include unintentional dictation errors.    Merlyn Lot, MD 12/21/17 2025

## 2017-12-21 NOTE — ED Notes (Signed)
Urologist at bedside.

## 2017-12-21 NOTE — ED Notes (Addendum)
Pt stated he didn't want family to come back at this time. First nurse notified. Pt informed urologist is on their way.    Hx prostate CA, currently colon CA. Has had radiation treatments to bladder back in the 90's/.

## 2017-12-21 NOTE — ED Triage Notes (Signed)
Pt arrives [facility] from home for urinary retention. Pt had hospice nurse come out and try to do a foley, unable to get it in. Pt arrives to ED very uncomfortable. Bladder scan showed >329ml. Pt has colostomy bag RLQ. C/o lower abd pain. Alert and oriented.

## 2017-12-21 NOTE — ED Notes (Signed)
Pt cleaned, new brief placed. Pt linen changed and new chuck placed under pt.

## 2017-12-23 ENCOUNTER — Telehealth: Payer: Self-pay | Admitting: Urology

## 2017-12-23 NOTE — Telephone Encounter (Signed)
-----   Message from Lucas Mallow, MD sent at 12/22/2017 11:46 AM EST ----- He should keep his catheter and do monthly cath changes. It was a difficult catheter to get in and seeing that he is on hospice, may be better to just have long term catheter.  ----- Message ----- From: Benard Halsted Sent: 12/22/2017  10:26 AM To: Lucas Mallow, MD  Called and spoke with the patient's daughter about making him an appointment. She stated that he was in hospice care and it was very hard for him to get out to appointments. He has to travel VIA EMS. She said that his hospice nurse was coming today and she would have her call me to discuss maybe them taking his catheter out on Sunday? Please let me know if this would be ok with you? Because the way we do it here is we would remove it and if he does not void he would have to come back and that's not going to work for his situation.  Sharyn Lull ----- Message ----- From: Lucas Mallow, MD Sent: 12/21/2017   4:53 PM To: Benard Halsted  Please arrange for outpatient f/u in 1-2 weeks in [place]

## 2017-12-23 NOTE — Telephone Encounter (Signed)
App made with patient's daughter  Sharyn Lull

## 2018-01-15 ENCOUNTER — Encounter: Payer: Self-pay | Admitting: Urology

## 2018-01-15 ENCOUNTER — Ambulatory Visit: Payer: PPO | Admitting: Urology

## 2018-01-15 VITALS — BP 113/60 | HR 114

## 2018-01-15 DIAGNOSIS — R339 Retention of urine, unspecified: Secondary | ICD-10-CM

## 2018-01-15 NOTE — Progress Notes (Signed)
01/15/2018 2:39 PM   Gregory Valdez Jan 19, 1915 [id number]  Referring provider: Cletis Athens, MD [address] [place], [place] [address]  Chief Complaint  Patient presents with  . Urinary Retention    HPI: Gregory Valdez is a 84 year old male who presents for hospital follow-up.  He has a history of BPH status post TURP as well as prostate cancer treated with external beam radiotherapy in the 1990s.  He was seen in the ED on 12/21/2017 with acute urinary retention and questionable blood clots in the bladder on ultrasound.  He is under hospice care for rectal cancer.  The ED was unable to place a Foley catheter and urology was consulted.  He was seen by Dr. Gloriann Loan and had a 66 French three-way Foley catheter placed however did not significant hematuria or clots.  Due to his prognosis and hospice care he and the family elected chronic catheter drainage.  He presents today for catheter change.  He has had no problems with hematuria.   PMH: Past Medical History:  Diagnosis Date  . Cancer [facility])    Prostate cancer with XRT  . Cataract   . Hypertension   . Neuropathy   . Rectal cancer ([place])   . Thyroid disease     Surgical History: Past Surgical History:  Procedure Laterality Date  . COLON RESECTION    . COLONOSCOPY  1995  . eliostomy  1995  . EYE SURGERY    . HERNIA REPAIR    . PROSTATE SURGERY      Home Medications:  Allergies as of 01/15/2018      Reactions   Sulfa Antibiotics       Medication List        Accurate as of 01/15/18  2:39 PM. Always use your most recent med list.          acetaminophen 500 MG tablet Commonly known as:  TYLENOL Take 1,000 mg by mouth every 6 (six) hours as needed.   amLODipine 10 MG tablet Commonly known as:  NORVASC Take 1 tablet (10 mg total) by mouth daily.   benzonatate 100 MG capsule Commonly known as:  TESSALON PERLES Take 1 capsule (100 mg total) by mouth every 6 (six) hours as needed for cough.   cloNIDine 0.1 MG  tablet Commonly known as:  CATAPRES Take 0.1 mg by mouth 2 (two) times daily. Take 2 in the morning and 1 in the evening.   feeding supplement (ENSURE ENLIVE) Liqd Take 237 mLs by mouth 3 (three) times daily between meals.   gabapentin 100 MG capsule Commonly known as:  NEURONTIN Take 100 mg by mouth at bedtime.   ipratropium 0.03 % nasal spray Commonly known as:  ATROVENT Place 2 sprays into the nose 3 (three) times daily.   levothyroxine 88 MCG tablet Commonly known as:  SYNTHROID, LEVOTHROID Take 88 mcg by mouth daily before breakfast.   loratadine 10 MG tablet Commonly known as:  CLARITIN Take 10 mg by mouth daily.   MULTI VITAMIN DAILY PO Take 1 tablet by mouth daily.   oxycodone 5 MG capsule Commonly known as:  OXY-IR Take 5 mg by mouth every 4 (four) hours as needed.   ranitidine 150 MG tablet Commonly known as:  ZANTAC Take 150 mg by mouth as needed for heartburn.   tamsulosin 0.4 MG Caps capsule Commonly known as:  FLOMAX Take 0.4 mg by mouth.       Allergies:  Allergies  Allergen Reactions  . Sulfa Antibiotics  Family History: Family History  Problem Relation Age of Onset  . Cancer Mother   . Prostate cancer Brother     Social History:  reports that he has quit smoking. He quit after 30.00 years of use. he has never used smokeless tobacco. He reports that he does not drink alcohol or use drugs.  ROS: UROLOGY Frequent Urination?: Yes Hard to postpone urination?: No Burning/pain with urination?: Yes Get up at night to urinate?: Yes Leakage of urine?: No Urine stream starts and stops?: Yes Trouble starting stream?: Yes Do you have to strain to urinate?: Yes Blood in urine?: Yes Urinary tract infection?: Yes Sexually transmitted disease?: No Injury to kidneys or bladder?: No Painful intercourse?: No Weak stream?: No Erection problems?: No Penile pain?: No    Physical Exam: BP 113/60   Pulse (!) 114   Constitutional:  Alert and  oriented, No acute distress. HEENT: [place] AT, moist mucus membranes.  Trachea midline, no masses. Cardiovascular: No clubbing, cyanosis, or edema. Respiratory: Normal respiratory effort, no increased work of breathing. GI: Abdomen is soft, nontender, nondistended, no abdominal masses.  Colostomy present GU: No CVA tenderness.  24 French three-way Foley catheter in place draining clear urine Skin: No rashes, bruises or suspicious lesions. Neurologic: Grossly intact, no focal deficits, moving all 4 extremities. Psychiatric: Normal mood and affect.  Laboratory Data: Lab Results  Component Value Date   WBC 10.7 (H) 12/21/2017   HGB 11.8 (L) 12/21/2017   HCT 36.3 (L) 12/21/2017   MCV 81.6 12/21/2017   PLT 349 12/21/2017    Lab Results  Component Value Date   CREATININE 1.17 12/21/2017    Lab Results  Component Value Date   PSA 0.99 08/14/2016    Assessment & Plan:   His Foley catheter was exchanged for a 16 [place] coud catheter which was placed without difficulty.  They desire to continue catheter drainage.  I did discuss suprapubic tube placement at some point if desired.   Abbie Sons, [place] [address], [place], [place] [address] [phone]

## 2018-01-15 NOTE — Progress Notes (Signed)
Cath Change/ Replacement  Patient is present today for a catheter change due to urinary retention.  56ml of water was removed from the balloon, a 24 coudeFR foley cath was removed with out difficulty.  Patient was cleaned and prepped in a sterile fashion with betadine and 2% lidocaine jelly was instilled into the urethra. A 16 coude FR foley cath was replaced into the bladder no complications were noted Urine return was noted 25ml and urine was red/yellow in color. The balloon was filled with 29ml of sterile water. A night bag was attached for drainage.  A night bag was also given to the patient and patient was given instruction on how to change from one bag to another. Patient was given proper instruction on catheter care.    Preformed by: Fonnie Jarvis, CMA  Follow up: 1 month

## 2018-02-12 ENCOUNTER — Ambulatory Visit: Payer: PPO | Admitting: Urology

## 2018-03-02 ENCOUNTER — Telehealth: Payer: Self-pay | Admitting: Urology

## 2018-03-02 NOTE — Telephone Encounter (Signed)
The nurse Sarah returned your call, you weren't at your desk.  Please give her a call at [phone]

## 2018-03-02 NOTE — Telephone Encounter (Signed)
Left mess to call 

## 2018-03-02 NOTE — Telephone Encounter (Signed)
Sarah with Hospice [phone]) called and left a voice mail on the nurse triage line regarding patient having problems with his catheter.  Please call her to discuss.

## 2018-03-02 NOTE — Telephone Encounter (Signed)
Spoke w/ Sarah at [facility] and per Dr. Erlene Quan she was notified ok to exchange foley and keep at an 18FR , irrigation prn. Per Sarah patient is currently being treated for a UTI due to fever and positive urine culture. Instructed to call if unable to irrigate.

## 2018-03-06 ENCOUNTER — Other Ambulatory Visit: Payer: Self-pay

## 2018-03-06 ENCOUNTER — Encounter: Payer: Self-pay | Admitting: Emergency Medicine

## 2018-03-06 ENCOUNTER — Emergency Department
Admission: EM | Admit: 2018-03-06 | Discharge: 2018-03-06 | Disposition: A | Discharge status: Home / Self Care | Attending: Emergency Medicine | Admitting: Emergency Medicine

## 2018-03-06 DIAGNOSIS — Z87891 Personal history of nicotine dependence: Secondary | ICD-10-CM | POA: Insufficient documentation

## 2018-03-06 DIAGNOSIS — Z85048 Personal history of other malignant neoplasm of rectum, rectosigmoid junction, and anus: Secondary | ICD-10-CM | POA: Insufficient documentation

## 2018-03-06 DIAGNOSIS — T83098A Other mechanical complication of other indwelling urethral catheter, initial encounter: Secondary | ICD-10-CM | POA: Insufficient documentation

## 2018-03-06 DIAGNOSIS — Z7401 Bed confinement status: Secondary | ICD-10-CM | POA: Diagnosis not present

## 2018-03-06 DIAGNOSIS — I10 Essential (primary) hypertension: Secondary | ICD-10-CM | POA: Insufficient documentation

## 2018-03-06 DIAGNOSIS — Z8546 Personal history of malignant neoplasm of prostate: Secondary | ICD-10-CM | POA: Diagnosis not present

## 2018-03-06 DIAGNOSIS — E039 Hypothyroidism, unspecified: Secondary | ICD-10-CM | POA: Insufficient documentation

## 2018-03-06 DIAGNOSIS — E860 Dehydration: Secondary | ICD-10-CM | POA: Insufficient documentation

## 2018-03-06 DIAGNOSIS — Y829 Unspecified medical devices associated with adverse incidents: Secondary | ICD-10-CM | POA: Diagnosis not present

## 2018-03-06 DIAGNOSIS — Z79899 Other long term (current) drug therapy: Secondary | ICD-10-CM | POA: Diagnosis not present

## 2018-03-06 DIAGNOSIS — R4182 Altered mental status, unspecified: Secondary | ICD-10-CM | POA: Diagnosis not present

## 2018-03-06 DIAGNOSIS — T839XXA Unspecified complication of genitourinary prosthetic device, implant and graft, initial encounter: Secondary | ICD-10-CM

## 2018-03-06 DIAGNOSIS — R339 Retention of urine, unspecified: Secondary | ICD-10-CM | POA: Insufficient documentation

## 2018-03-06 DIAGNOSIS — N399 Disorder of urinary system, unspecified: Secondary | ICD-10-CM | POA: Diagnosis not present

## 2018-03-06 LAB — URINALYSIS, COMPLETE (UACMP) WITH MICROSCOPIC
BACTERIA UA: NONE SEEN
Bilirubin Urine: NEGATIVE
GLUCOSE, UA: NEGATIVE mg/dL
KETONES UR: NEGATIVE mg/dL
Nitrite: NEGATIVE
PROTEIN: 100 mg/dL — AB
SQUAMOUS EPITHELIAL / LPF: NONE SEEN (ref 0–5)
Specific Gravity, Urine: 1.016 (ref 1.005–1.030)
WBC, UA: >50 WBC/hpf — ABNORMAL HIGH (ref 0–5)
pH: 6 (ref 5.0–8.0)

## 2018-03-06 MED ORDER — SODIUM CHLORIDE 0.9 % IV BOLUS
1000.0000 mL | Freq: Once | INTRAVENOUS | Status: AC
  Administered 2018-03-06: 1000 mL via INTRAVENOUS

## 2018-03-06 MED ORDER — LIDOCAINE HCL URETHRAL/MUCOSAL 2 % EX GEL
1.0000 "application " | Freq: Once | CUTANEOUS | Status: AC
  Administered 2018-03-06: 1 via URETHRAL
  Filled 2018-03-06: qty 10

## 2018-03-06 NOTE — ED Notes (Signed)
[facility] called/waiting on trucks to check in pt is first one list

## 2018-03-06 NOTE — ED Triage Notes (Signed)
Pt bib [facility] from home d/t urinary retention. Hospice nurse d/c pt foley d/t suprapubic pain and foley not draining. Called 911 for transport to have new foley placed. According to hospice nurse, urologist had to place last foley d/t difficulty. EDP at bedside.

## 2018-03-06 NOTE — ED Provider Notes (Signed)
[facility] Emergency Department Provider Note  ____________________________________________  Time seen: Approximately 6:11 AM  I have reviewed the triage vital signs and the nursing notes.   HISTORY  Chief Complaint Urinary Retention    HPI Gregory Valdez is a 84 y.o. male with a history of hypertension, prostate cancer, colon cancer who is under hospice care, has a chronic indwelling Foley catheter. The last 2 days he's had decreased urine output from the catheter. This evening, his hospice nurse evaluated him, thought the catheter was obstructed, and removed it. They then sent him to the ED for placement of a new Foley catheter.  Review of electronic medical record shows that the patient had a Foley catheter placed by urology for acute urinary retention just a few months ago. He then followed up in urology clinic where was exchanged uneventfully. According to the urologist, 16-18 French catheter works fine and they reported no difficulty passing the catheter with a coud tip.  Patient complains of lower abdominal pain, nonradiating, constant, moderate to severe. No aggravating or alleviating factors.      Past Medical History:  Diagnosis Date  . Cancer [facility]    Prostate cancer with XRT  . Cataract   . Hypertension   . Neuropathy   . Rectal cancer ([place])   . Thyroid disease      Patient Active Problem List   Diagnosis Date Noted  . Acute renal failure (ARF) ([place]) 02/23/2017  . Rectal mass 08/13/2016     Past Surgical History:  Procedure Laterality Date  . COLON RESECTION    . COLONOSCOPY  1995  . eliostomy  1995  . EYE SURGERY    . HERNIA REPAIR    . PROSTATE SURGERY       Prior to Admission medications   Medication Sig Start Date End Date Taking? Authorizing Provider  acetaminophen (TYLENOL) 500 MG tablet Take 1,000 mg by mouth every 6 (six) hours as needed.    [provider]  amLODipine (NORVASC) 10 MG tablet Take 1  tablet (10 mg total) by mouth daily. 02/26/17   Fritzi Mandes, MD  benzonatate (TESSALON PERLES) 100 MG capsule Take 1 capsule (100 mg total) by mouth every 6 (six) hours as needed for cough. 11/11/17 11/11/18  Darel Hong, MD  cloNIDine (CATAPRES) 0.1 MG tablet Take 0.1 mg by mouth 2 (two) times daily. Take 2 in the morning and 1 in the evening.    [provider]  feeding supplement, ENSURE ENLIVE, (ENSURE ENLIVE) LIQD Take 237 mLs by mouth 3 (three) times daily between meals. 02/25/17   Fritzi Mandes, MD  gabapentin (NEURONTIN) 100 MG capsule Take 100 mg by mouth at bedtime.    [provider]  ipratropium (ATROVENT) 0.03 % nasal spray Place 2 sprays into the nose 3 (three) times daily. 11/11/17 11/11/18  Darel Hong, MD  levothyroxine (SYNTHROID, LEVOTHROID) 88 MCG tablet Take 88 mcg by mouth daily before breakfast.    [provider]  loratadine (CLARITIN) 10 MG tablet Take 10 mg by mouth daily.    [provider]  Multiple Vitamin (MULTI VITAMIN DAILY PO) Take 1 tablet by mouth daily.     [provider]  oxycodone (OXY-IR) 5 MG capsule Take 5 mg by mouth every 4 (four) hours as needed.    [provider]  ranitidine (ZANTAC) 150 MG tablet Take 150 mg by mouth as needed for heartburn.    [provider]  tamsulosin (FLOMAX) 0.4 MG CAPS  capsule Take 0.4 mg by mouth.    [provider]     Allergies Sulfa antibiotics   Family History  Problem Relation Age of Onset  . Cancer Mother   . Prostate cancer Brother     Social History Social History   Tobacco Use  . Smoking status: Former Smoker    Years: 30.00  . Smokeless tobacco: Never Used  Substance Use Topics  . Alcohol use: No  . Drug use: No    Review of Systems  Constitutional:   No fever or chills.  ENT:   No sore throat. No rhinorrhea. Cardiovascular:   No chest pain or syncope. Respiratory:   No dyspnea or cough. Gastrointestinal:   no abdominal pain  as above without vomiting and diarrhea.  Musculoskeletal:   Negative for focal pain or swelling All other systems reviewed and are negative except as documented above in ROS and HPI.  ____________________________________________   PHYSICAL EXAM:  VITAL SIGNS: ED Triage Vitals  Enc Vitals Group     BP 03/06/18 0330 118/69     Pulse Rate 03/06/18 0330 (!) 113     Resp --      Temp 03/06/18 0328 98.8 F (37.1 C)     Temp Source 03/06/18 0328 Oral     SpO2 03/06/18 0330 94 %     Weight --      Height --      Head Circumference --      Peak Flow --      Pain Score --      Pain Loc --      Pain Edu? --      Excl. in [place]? --     Vital signs reviewed, nursing assessments reviewed.   Constitutional:   Alert and oriented. Well appearing and in no distress. Eyes:   Conjunctivae are normal. EOMI. PERRL. ENT      Head:   Normocephalic and atraumatic.      Nose:   No congestion/rhinnorhea.       Mouth/Throat:   dry mucous membranes, no pharyngeal erythema. No peritonsillar mass.       Neck:   No meningismus. Full ROM. Hematological/Lymphatic/Immunilogical:   No cervical lymphadenopathy. Cardiovascular:   tachycardia heart rate 110. Symmetric bilateral radial and DP pulses.  No murmurs.  Respiratory:   Normal respiratory effort without tachypnea/retractions. Breath sounds are clear and equal bilaterally. No wheezes/rales/rhonchi. Gastrointestinal:   Soft and nontender. Non distended. There is no CVA tenderness.  No rebound, rigidity, or guarding. Genitourinary:   normal, circumcised Musculoskeletal:   Normal range of motion in all extremities. No joint effusions.  No lower extremity tenderness.  No edema. Neurologic:   Normal speech and language.  Motor grossly intact. No acute focal neurologic deficits are appreciated.  Skin:    Skin is warm, dry and intact. No rash noted.  No petechiae, purpura, or bullae.  ____________________________________________    LABS (pertinent  positives/negatives) (all labs ordered are listed, but only abnormal results are displayed) Labs Reviewed - No data to display ____________________________________________   EKG    ____________________________________________    RADIOLOGY  No results found.  ____________________________________________   PROCEDURES Procedures Foley catheter placed by me 26 French, coud-tip Usual sterile technique Successful on first attempt with minimal resistance to passage, no complications. Spontaneous return of about 50 mL of pink cloudy urine. ____________________________________________    CLINICAL IMPRESSION / ASSESSMENT AND PLAN / ED COURSE  Pertinent labs & imaging results that were  available during my care of the patient were reviewed by me and considered in my medical decision making (see chart for details).      Clinical Course as of Mar 06 610  Fri Mar 06, 2018  0317 P/w urinary retention after chronic foley was found not to be draining and was removed by hospice nurse. No other acute complaints.    [PS]  [address] [place] foley placed by me. Pink cloudy urine without clots. Not a lot of drainage - will bladder scan and give IVF for hydration. Suspect poor UOP due to dehydration.    [PS]    Clinical Course User Index [PS] Carrie Mew, MD    ----------------------------------------- 7:01 AM on 03/06/2018 -----------------------------------------  Heart rate improved with fluids. Urine sent for follow-up culture. Stable for discharge back to hospice care at this time.   ____________________________________________   FINAL CLINICAL IMPRESSION(S) / ED DIAGNOSES    Final diagnoses:  Problem with Foley catheter, initial encounter [facility])  Dehydration     ED Discharge Orders    None      Portions of this note were generated with dragon dictation software. Dictation errors may occur despite best attempts at proofreading.    Carrie Mew,  MD 03/06/18 [phone]

## 2018-03-06 NOTE — ED Notes (Signed)
18 coude placed by Joni Fears, MD

## 2018-03-06 NOTE — ED Notes (Addendum)
Pt ate ice cream, sipped water with family. Home scheduled pain meds given by family per EDP approval. Pt denies feeling the need to void at this time.

## 2018-03-07 LAB — URINE CULTURE

## 2018-04-04 DEATH — deceased
